# Patient Record
Sex: Female | Born: 1995 | Race: Black or African American | Hispanic: No | Marital: Single | Smoking: Current every day smoker
Health system: Southern US, Community
[De-identification: ages and names within clinical notes are randomized; demographics above are authoritative.]

## PROBLEM LIST (undated history)

## (undated) ENCOUNTER — Inpatient Hospital Stay (HOSPITAL_COMMUNITY): Payer: Self-pay

## (undated) ENCOUNTER — Ambulatory Visit (HOSPITAL_COMMUNITY): Admission: EM | Discharge status: Absent without leave | Payer: Medicaid Other

## (undated) DIAGNOSIS — Z789 Other specified health status: Secondary | ICD-10-CM

## (undated) HISTORY — PX: NO PAST SURGERIES: SHX2092

---

## 2015-10-15 ENCOUNTER — Encounter (HOSPITAL_COMMUNITY): Payer: Self-pay | Admitting: *Deleted

## 2015-10-15 ENCOUNTER — Emergency Department (HOSPITAL_COMMUNITY)
Admission: EM | Admit: 2015-10-15 | Discharge: 2015-10-15 | Disposition: A | Discharge status: Home / Self Care | Payer: Self-pay | Attending: Emergency Medicine | Admitting: Emergency Medicine

## 2015-10-15 DIAGNOSIS — W503XXA Accidental bite by another person, initial encounter: Secondary | ICD-10-CM | POA: Insufficient documentation

## 2015-10-15 DIAGNOSIS — Y998 Other external cause status: Secondary | ICD-10-CM | POA: Insufficient documentation

## 2015-10-15 DIAGNOSIS — Y9289 Other specified places as the place of occurrence of the external cause: Secondary | ICD-10-CM | POA: Insufficient documentation

## 2015-10-15 DIAGNOSIS — S71151A Open bite, right thigh, initial encounter: Secondary | ICD-10-CM | POA: Insufficient documentation

## 2015-10-15 DIAGNOSIS — Z23 Encounter for immunization: Secondary | ICD-10-CM | POA: Insufficient documentation

## 2015-10-15 DIAGNOSIS — S70311A Abrasion, right thigh, initial encounter: Secondary | ICD-10-CM | POA: Insufficient documentation

## 2015-10-15 DIAGNOSIS — F17200 Nicotine dependence, unspecified, uncomplicated: Secondary | ICD-10-CM | POA: Insufficient documentation

## 2015-10-15 DIAGNOSIS — Y9389 Activity, other specified: Secondary | ICD-10-CM | POA: Insufficient documentation

## 2015-10-15 MED ORDER — TETANUS-DIPHTH-ACELL PERTUSSIS 5-2.5-18.5 LF-MCG/0.5 IM SUSP
0.5000 mL | Freq: Once | INTRAMUSCULAR | Status: AC
  Administered 2015-10-15: 0.5 mL via INTRAMUSCULAR
  Filled 2015-10-15: qty 0.5

## 2015-10-15 MED ORDER — AMOXICILLIN-POT CLAVULANATE 875-125 MG PO TABS: 1.0000 | ORAL_TABLET | Freq: Two times a day (BID) | ORAL | Status: DC
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
## 2015-10-15 NOTE — ED Notes (Signed)
Declined W/C at D/C and was escorted to lobby by RN. 

## 2015-10-15 NOTE — Discharge Instructions (Signed)
Please monitor for signs of infection, pain present please begin taking antibiotics., Return to emergency room if any new or worsening signs or symptoms present.

## 2016-05-08 ENCOUNTER — Encounter (HOSPITAL_COMMUNITY): Payer: Self-pay | Admitting: Emergency Medicine

## 2016-05-08 ENCOUNTER — Ambulatory Visit (HOSPITAL_COMMUNITY)
Admission: EM | Admit: 2016-05-08 | Discharge: 2016-05-08 | Disposition: A | Discharge status: Home / Self Care | Payer: Self-pay | Attending: Family Medicine | Admitting: Family Medicine

## 2016-05-08 DIAGNOSIS — N946 Dysmenorrhea, unspecified: Secondary | ICD-10-CM

## 2016-05-08 MED ORDER — IBUPROFEN 800 MG PO TABS: 800.0000 mg | ORAL_TABLET | Freq: Four times a day (QID) | ORAL | 0 refills | Status: DC | PRN

## 2016-05-08 NOTE — ED Provider Notes (Signed)
[facility]  CSN: [id number] Arrival date & time: 05/08/16  1436  History   Chief Complaint Chief Complaint  Patient presents with  . Dysmenorrhea   HPI April Barber is a 20 y.o. female presenting for painful menstrual cramps.   She reports regular monthly periods lasting 5 days since she was 10, initially causing mild cramping pain which has escalated in the past 3 months. She is currently on day 2 of her period and experiencing severe, intermittent crampy lower abdominal pain that is incapacitation to the point that she called out of her 2 jobs today. Excedrin has not helped and in the past tylenol and tramadol have not either. She is sexually active with a single female and uses no hormonal contraception. She is afraid of side effects and stories that she's been told that this has not helped with her friends' with the same problems. No fever, vaginal discharge, dysuria, urinary frequency or urgency. Tolerating po fluids.  HPI  History reviewed. No pertinent past medical history.  There are no active problems to display for this patient.   History reviewed. No pertinent surgical history.  OB History    No data available       Home Medications    Prior to Admission medications   Medication Sig Start Date End Date Taking? Authorizing Provider  ibuprofen (ADVIL,MOTRIN) 800 MG tablet Take 1 tablet (800 mg total) by mouth every 6 (six) hours as needed for cramping. 05/08/16   Tyrone Nineyan B Paulina Muchmore, MD    Family History History reviewed. No pertinent family history.  Social History Social History  Substance Use Topics  . Smoking status: Current Some Day Smoker  . Smokeless tobacco: Never Used  . Alcohol use No     Allergies   Patient has no known allergies.   Review of Systems Review of Systems As above  Physical Exam Triage Vital Signs ED Triage Vitals  Enc Vitals Group     BP 05/08/16 1510 118/65     Pulse Rate 05/08/16 1510 (!) 59     Resp 05/08/16  1510 18     Temp 05/08/16 1510 98.4 F (36.9 C)     Temp Source 05/08/16 1510 Oral     SpO2 05/08/16 1510 100 %     Weight --      Height 05/08/16 1510 5\' 7"  (1.702 m)     Head Circumference --      Peak Flow --      Pain Score 05/08/16 1515 7     Pain Loc --      Pain Edu? --      Excl. in GC? --    No data found.   Updated Vital Signs BP 118/65 (BP Location: Right Arm)   Pulse (!) 59   Temp 98.4 F (36.9 C) (Oral)   Resp 18   Ht 5\' 7"  (1.702 m)   LMP 05/08/2016 (Exact Date)   SpO2 100%   Physical Exam  Constitutional: She is oriented to person, place, and time. She appears well-developed and well-nourished. No distress.  HENT:  moist mucus membranes.   Eyes: EOM are normal. Pupils are equal, round, and reactive to light. No scleral icterus.  Neck: Neck supple. No JVD present.  Cardiovascular: Normal rate, regular rhythm, normal heart sounds and intact distal pulses.   No murmur heard. Pulmonary/Chest: Effort normal and breath sounds normal. No respiratory distress.  Abdominal: Soft. Bowel sounds are normal. She exhibits no distension. There is tenderness (mild  lower abdominal tenderness to palpation without rebound or guarding.).  Musculoskeletal: Normal range of motion. She exhibits no edema or tenderness.  Lymphadenopathy:    She has no cervical adenopathy.  Neurological: She is alert and oriented to person, place, and time. She exhibits normal muscle tone.  Skin: Skin is warm and dry. Capillary refill takes less than 2 seconds.  Vitals reviewed.  UC Treatments / Results  Labs (all labs ordered are listed, but only abnormal results are displayed) Labs Reviewed - No data to display  EKG  EKG Interpretation None       Radiology No results found.  Procedures Procedures (including critical care time)  Medications Ordered in UC Medications - No data to display   Initial Impression / Assessment and Plan / UC Course  I have reviewed the triage vital  signs and the nursing notes.  Pertinent labs & imaging results that were available during my care of the patient were reviewed by me and considered in my medical decision making (see chart for details).  Final Clinical Impressions(s) / UC Diagnoses   Final diagnoses:  Dysmenorrhea   20 y.o. female with longstanding history of dysmenorrhea with acute typical symptoms during menses currently. No peritoneal signs, she is nontoxic. Will Rx ibuprofen 800mg  prn, avoid other NSAIDs while taking this, and follow up with PCP for discussion of birth control options.   New Prescriptions New Prescriptions   IBUPROFEN (ADVIL,MOTRIN) 800 MG TABLET    Take 1 tablet (800 mg total) by mouth every 6 (six) hours as needed for cramping.     Tyrone Nineyan B Jakylan Ron, MD 05/08/16 [phone]

## 2016-05-08 NOTE — ED Triage Notes (Signed)
[facility]. The patient reported that for the last 3 months she has had painful cramping during menstruation and abdominal pain that starts after eating.

## 2016-06-22 ENCOUNTER — Encounter (HOSPITAL_COMMUNITY): Payer: Self-pay

## 2016-06-22 ENCOUNTER — Emergency Department (HOSPITAL_COMMUNITY)
Admission: EM | Admit: 2016-06-22 | Discharge: 2016-06-22 | Disposition: A | Discharge status: Home / Self Care | Payer: Medicaid Other | Attending: Emergency Medicine | Admitting: Emergency Medicine

## 2016-06-22 ENCOUNTER — Emergency Department (HOSPITAL_COMMUNITY): Payer: Medicaid Other

## 2016-06-22 DIAGNOSIS — O23591 Infection of other part of genital tract in pregnancy, first trimester: Secondary | ICD-10-CM | POA: Insufficient documentation

## 2016-06-22 DIAGNOSIS — O0281 Inappropriate change in quantitative human chorionic gonadotropin (hCG) in early pregnancy: Secondary | ICD-10-CM | POA: Insufficient documentation

## 2016-06-22 DIAGNOSIS — O99331 Smoking (tobacco) complicating pregnancy, first trimester: Secondary | ICD-10-CM | POA: Insufficient documentation

## 2016-06-22 DIAGNOSIS — R21 Rash and other nonspecific skin eruption: Secondary | ICD-10-CM | POA: Insufficient documentation

## 2016-06-22 DIAGNOSIS — Z3A01 Less than 8 weeks gestation of pregnancy: Secondary | ICD-10-CM | POA: Diagnosis not present

## 2016-06-22 DIAGNOSIS — N760 Acute vaginitis: Secondary | ICD-10-CM

## 2016-06-22 DIAGNOSIS — O9989 Other specified diseases and conditions complicating pregnancy, childbirth and the puerperium: Secondary | ICD-10-CM | POA: Insufficient documentation

## 2016-06-22 DIAGNOSIS — B9689 Other specified bacterial agents as the cause of diseases classified elsewhere: Secondary | ICD-10-CM | POA: Insufficient documentation

## 2016-06-22 DIAGNOSIS — R1030 Lower abdominal pain, unspecified: Secondary | ICD-10-CM | POA: Diagnosis not present

## 2016-06-22 DIAGNOSIS — O26891 Other specified pregnancy related conditions, first trimester: Secondary | ICD-10-CM | POA: Diagnosis present

## 2016-06-22 DIAGNOSIS — F17290 Nicotine dependence, other tobacco product, uncomplicated: Secondary | ICD-10-CM | POA: Diagnosis not present

## 2016-06-22 DIAGNOSIS — O26899 Other specified pregnancy related conditions, unspecified trimester: Secondary | ICD-10-CM

## 2016-06-22 DIAGNOSIS — R109 Unspecified abdominal pain: Secondary | ICD-10-CM

## 2016-06-22 LAB — COMPREHENSIVE METABOLIC PANEL
ALK PHOS: 54 U/L (ref 38–126)
ALT: 14 U/L (ref 14–54)
ANION GAP: 9 (ref 5–15)
AST: 18 U/L (ref 15–41)
Albumin: 3.8 g/dL (ref 3.5–5.0)
BUN: 7 mg/dL (ref 6–20)
CALCIUM: 8.9 mg/dL (ref 8.9–10.3)
CHLORIDE: 104 mmol/L (ref 101–111)
CO2: 24 mmol/L (ref 22–32)
Creatinine, Ser: 0.84 mg/dL (ref 0.44–1.00)
GFR calc non Af Amer: >60 mL/min (ref >60)
Glucose, Bld: 98 mg/dL (ref 65–99)
POTASSIUM: 3.5 mmol/L (ref 3.5–5.1)
SODIUM: 137 mmol/L (ref 135–145)
Total Bilirubin: 0.7 mg/dL (ref 0.3–1.2)
Total Protein: 6.5 g/dL (ref 6.5–8.1)

## 2016-06-22 LAB — URINALYSIS, ROUTINE W REFLEX MICROSCOPIC
Bilirubin Urine: NEGATIVE
GLUCOSE, UA: NEGATIVE mg/dL
HGB URINE DIPSTICK: NEGATIVE
KETONES UR: NEGATIVE mg/dL
Nitrite: NEGATIVE
PROTEIN: 30 mg/dL — AB
Specific Gravity, Urine: 1.025 (ref 1.005–1.030)
pH: 8 (ref 5.0–8.0)

## 2016-06-22 LAB — WET PREP, GENITAL
SPERM: NONE SEEN
Trich, Wet Prep: NONE SEEN
WBC WET PREP: NONE SEEN
Yeast Wet Prep HPF POC: NONE SEEN

## 2016-06-22 LAB — CBC
HEMATOCRIT: 36.1 % (ref 36.0–46.0)
HEMOGLOBIN: 12.3 g/dL (ref 12.0–15.0)
MCH: 27.6 pg (ref 26.0–34.0)
MCHC: 34.1 g/dL (ref 30.0–36.0)
MCV: 80.9 fL (ref 78.0–100.0)
Platelets: 227 10*3/uL (ref 150–400)
RBC: 4.46 MIL/uL (ref 3.87–5.11)
RDW: 12.6 % (ref 11.5–15.5)
WBC: 5.2 10*3/uL (ref 4.0–10.5)

## 2016-06-22 LAB — I-STAT BETA HCG BLOOD, ED (MC, WL, AP ONLY): I-stat hCG, quantitative: >2000 m[IU]/mL — ABNORMAL HIGH (ref <5)

## 2016-06-22 LAB — LIPASE, BLOOD: LIPASE: 21 U/L (ref 11–51)

## 2016-06-22 LAB — HCG, QUANTITATIVE, PREGNANCY: hCG, Beta Chain, Quant, S: 25051 m[IU]/mL — ABNORMAL HIGH (ref <5)

## 2016-06-22 MED ORDER — PRENATAL COMPLETE 14-0.4 MG PO TABS: 2.0000 | ORAL_TABLET | Freq: Every day | ORAL | 0 refills | Status: DC

## 2016-06-22 MED ORDER — HYDROCORTISONE 2.5 % EX LOTN
TOPICAL_LOTION | Freq: Two times a day (BID) | CUTANEOUS | 0 refills | Status: DC
Start: 2016-06-22 — End: 2016-09-25

## 2016-06-22 MED ORDER — METRONIDAZOLE 500 MG PO TABS: 500.0000 mg | ORAL_TABLET | Freq: Two times a day (BID) | ORAL | 0 refills | Status: DC

## 2016-06-22 NOTE — ED Notes (Signed)
Pt returned from US

## 2016-06-22 NOTE — Discharge Instructions (Addendum)
1. Medications: prenatal vitamins, Flagyl, hydrocortisone lotion, usual home medications 2. Treatment: rest, drink plenty of fluids, eat bland meals, small and often 3. Follow Up: Please followup with OB/GYN in 1-2 weeks for discussion of your diagnoses and further evaluation after today's visit; if you do not have a primary care doctor use the resource guide provided to find one; Please return to the ER for persistent vomiting, vaginal bleeding, syncope, worsening abd pain, high fevers or worsening symptoms

## 2016-06-22 NOTE — ED Triage Notes (Signed)
Onset first week in December intermittant lower abd pain, occurs off and on all day.  Unsure pregnancy status.  Onset 3-4 weeks itchy rash on right forearm.  Has been using  Benadryl cream with some relief in itching.

## 2016-06-22 NOTE — ED Notes (Signed)
Patient transported to US 

## 2016-06-22 NOTE — ED Provider Notes (Signed)
[facility]   CSN: [id number] Arrival date & time: 06/22/16  1815     History   Chief Complaint Chief Complaint  Patient presents with  . Abdominal Pain  . Rash    HPI April Barber is a 20 y.o. female G1P0 with No major medical history presents to the Emergency Department intermittent lower abdominal pain described as cramping onset several weeks ago. Patient reports she is sexually active with 1 female partner. No history of STDs, no vaginal discharge, no vaginal bleeding. Last menstrual cycle was 05/10/2016. Patient has not taken a home pregnancy test. She was not trying to get pregnant. She reports nausea intermittently and several bouts of emesis over the last several weeks. She denies fever, chills, diarrhea, weakness, dizziness, syncope, dysuria, hematuria, vaginal discharge.  Patient is also complaining of 2 months of dry itchy rash to the right forearm. Patient has used topical Benadryl without relief. The rash is no where else else on the patient's body. Fevers or chills, nausea or vomiting. No recent travel.  The history is provided by the patient and medical records. No language interpreter was used.    History reviewed. No pertinent past medical history.  There are no active problems to display for this patient.   History reviewed. No pertinent surgical history.  OB History    No data available       Home Medications    Prior to Admission medications   Medication Sig Start Date End Date Taking? Authorizing Provider  hydrocortisone 2.5 % lotion Apply topically 2 (two) times daily. 06/22/16   Ceniya Fowers, PA-C  ibuprofen (ADVIL,MOTRIN) 800 MG tablet Take 1 tablet (800 mg total) by mouth every 6 (six) hours as needed for cramping. 05/08/16   Tyrone Nineyan B Grunz, MD  metroNIDAZOLE (FLAGYL) 500 MG tablet Take 1 tablet (500 mg total) by mouth 2 (two) times daily. One po bid x 7 days 06/22/16   Dahlia ClientHannah Bleu Minerd, PA-C  Prenatal Vit-Fe  Fumarate-FA (PRENATAL COMPLETE) 14-0.4 MG TABS Take 2 tablets by mouth daily. 06/22/16   Dahlia ClientHannah Azilee Pirro, PA-C    Family History History reviewed. No pertinent family history.  Social History Social History  Substance Use Topics  . Smoking status: Current Every Day Smoker    Types: Cigars  . Smokeless tobacco: Never Used     Comment: 2 cigars per day   . Alcohol use Yes     Comment: occ     Allergies   Patient has no known allergies.   Review of Systems Review of Systems  Gastrointestinal: Positive for abdominal pain, nausea and vomiting.  Skin: Positive for rash.  All other systems reviewed and are negative.    Physical Exam Updated Vital Signs BP 114/62   Pulse 71   Temp 98.7 F (37.1 C) (Oral)   Resp 14   LMP 05/10/2016   SpO2 100%   Physical Exam  Constitutional: She appears well-developed and well-nourished. No distress.  Awake, alert, nontoxic appearance  HENT:  Head: Normocephalic and atraumatic.  Mouth/Throat: Oropharynx is clear and moist. No oropharyngeal exudate.  Eyes: Conjunctivae are normal. No scleral icterus.  Neck: Normal range of motion. Neck supple.  Cardiovascular: Normal rate, regular rhythm, normal heart sounds and intact distal pulses.   No murmur heard. Pulmonary/Chest: Effort normal and breath sounds normal. No respiratory distress. She has no wheezes.  Equal chest expansion  Abdominal: Soft. Bowel sounds are normal. She exhibits no mass. There is no tenderness. There is no rebound and  no guarding. Hernia confirmed negative in the right inguinal area and confirmed negative in the left inguinal area.  Genitourinary: Uterus normal. No labial fusion. There is no rash, tenderness or lesion on the right labia. There is no rash, tenderness or lesion on the left labia. Uterus is not deviated, not enlarged, not fixed and not tender. Cervix exhibits no motion tenderness, no discharge and no friability. Right adnexum displays no mass, no  tenderness and no fullness. Left adnexum displays no mass, no tenderness and no fullness. No erythema, tenderness or bleeding in the vagina. No foreign body in the vagina. No signs of injury around the vagina. Vaginal discharge (copious amounts of thick white discharge ) found.  Musculoskeletal: Normal range of motion. She exhibits no edema.  Lymphadenopathy:       Right: No inguinal adenopathy present.       Left: No inguinal adenopathy present.  Neurological: She is alert.  Speech is clear and goal oriented Moves extremities without ataxia  Skin: Skin is warm and dry. Capillary refill takes 2 to 3 seconds. Rash noted. Rash is macular. She is not diaphoretic. No erythema.  Macular and scaling rash to the right lower forearm no urticaria, bulla, vesicles, ulcers or target lesions.  Psychiatric: She has a normal mood and affect.  Nursing note and vitals reviewed.    ED Treatments / Results  Labs (all labs ordered are listed, but only abnormal results are displayed) Labs Reviewed  WET PREP, GENITAL - Abnormal; Notable for the following:       Result Value   Clue Cells Wet Prep HPF POC PRESENT (*)    All other components within normal limits  URINALYSIS, ROUTINE W REFLEX MICROSCOPIC - Abnormal; Notable for the following:    APPearance HAZY (*)    Protein, ur 30 (*)    Leukocytes, UA SMALL (*)    Bacteria, UA RARE (*)    Squamous Epithelial / LPF 6-30 (*)    All other components within normal limits  HCG, QUANTITATIVE, PREGNANCY - Abnormal; Notable for the following:    hCG, Beta Chain, Quant, S 25,051 (*)    All other components within normal limits  I-STAT BETA HCG BLOOD, ED ([facility], [facility], [facility] ONLY) - Abnormal; Notable for the following:    I-stat hCG, quantitative >2,000.0 (*)    All other components within normal limits  LIPASE, BLOOD  COMPREHENSIVE METABOLIC PANEL  CBC  GC/CHLAMYDIA PROBE AMP ([place]) NOT AT [facility]    Radiology US Ob Comp < 14 Wks  Result Date:  06/22/2016 CLINICAL DATA:  Abdominal pain. EXAM: OBSTETRIC <14 WK [place] AND TRANSVAGINAL OB US TECHNIQUE: Both transabdominal and transvaginal ultrasound examinations were performed for complete evaluation of the gestation as well as the maternal uterus, adnexal regions, and pelvic cul-de-sac. Transvaginal technique was performed to assess early pregnancy. COMPARISON:  None. FINDINGS: Intrauterine gestational sac: Single Yolk sac:  Visible Embryo:  Visible Cardiac Activity: This is a Heart Rate: 108  bpm MSD:   mm    w     d CRL:  2.2  mm   5 w   5 d                  [place] EDC: 02/17/2017 Subchorionic hemorrhage:  None visualized. Maternal uterus/adnexae: Normal ovaries.  Trace free pelvic fluid. IMPRESSION: Single living intrauterine gestation measuring 5 weeks 5 days by crown-rump length. Electronically Signed   By: Ellery Plunk M.D.   On: 06/22/2016 21:51  [place] Ob Transvaginal  Result Date: 06/22/2016 CLINICAL DATA:  Abdominal pain. EXAM: OBSTETRIC <14 WK US AND TRANSVAGINAL OB US TECHNIQUE: Both transabdominal and transvaginal ultrasound examinations were performed for complete evaluation of the gestation as well as the maternal uterus, adnexal regions, and pelvic cul-de-sac. Transvaginal technique was performed to assess early pregnancy. COMPARISON:  None. FINDINGS: Intrauterine gestational sac: Single Yolk sac:  Visible Embryo:  Visible Cardiac Activity: This is a Heart Rate: 108  bpm MSD:   mm    w     d CRL:  2.2  mm   5 w   5 d                  US EDC: 02/17/2017 Subchorionic hemorrhage:  None visualized. Maternal uterus/adnexae: Normal ovaries.  Trace free pelvic fluid. IMPRESSION: Single living intrauterine gestation measuring 5 weeks 5 days by crown-rump length. Electronically Signed   By: Ellery Plunkaniel R Mitchell M.D.   On: 06/22/2016 21:51    Procedures Procedures (including critical care time)  Medications Ordered in ED Medications - No data to display   Initial Impression / Assessment and Plan  / ED Course  I have reviewed the triage vital signs and the nursing notes.  Pertinent labs & imaging results that were available during my care of the patient were reviewed by me and considered in my medical decision making (see chart for details).  Clinical Course as of Jun 22 2301  Sat Jun 22, 2016  2103 Positive pregnancy test I-stat hCG, quantitative: (!) >2,000.0 [HM]  2103 Small with negative nitrite. Contaminated sample with rare bacteria. 6-30 white blood cells. Patient without dysuria, hematuria or other symptoms of UTI. Leukocytes, UA: (!) SMALL [HM]  2300 Clue cells and patient with lower abdominal pain. Will treat with metronidazole. Clue Cells Wet Prep HPF POC: (!) PRESENT [HM]  2300 IUP US OB Transvaginal [HM]    Clinical Course User Index [HM] Dahlia ClientHannah Jeanclaude Wentworth, PA-C    Vision presents with complaints of lower abdominal pain. Positive pregnancy test. Ultrasound with evidence of IUP at approximately 5 weeks. Wet prep shows clue cells and patient has large amount of vaginal discharge with lower abdominal pain. Will treat with metronidazole. No evidence of UTI.  Discussed normal progression of pregnancy with patient and reasons to be concerned including worsening pain and vaginal bleeding. Patient referred to OB/GYN.  Patient with nonspecific rash to the right arm present for one month. Does not appear to be allergic in nature. No ulcers, bulla, target lesions. Will give hydrocortisone. Patient is to follow with dermatology.    Final Clinical Impressions(s) / ED Diagnoses   Final diagnoses:  Abdominal pain affecting pregnancy  Lower abdominal pain  Less than [redacted] weeks gestation of pregnancy  Rash  BV (bacterial vaginosis)    New Prescriptions New Prescriptions   HYDROCORTISONE 2.5 % LOTION    Apply topically 2 (two) times daily.   METRONIDAZOLE (FLAGYL) 500 MG TABLET    Take 1 tablet (500 mg total) by mouth 2 (two) times daily. One po bid x 7 days   PRENATAL VIT-FE  FUMARATE-FA (PRENATAL COMPLETE) 14-0.4 MG TABS    Take 2 tablets by mouth daily.     Dahlia ClientHannah Jahlen Bollman, PA-C 06/22/16 [id number]    Pricilla LovelessScott Goldston, MD 06/24/16 [phone]

## 2016-06-25 LAB — GC/CHLAMYDIA PROBE AMP (~~LOC~~) NOT AT ARMC
Chlamydia: POSITIVE — AB
NEISSERIA GONORRHEA: NEGATIVE

## 2016-07-01 NOTE — L&D Delivery Note (Signed)
Delivery Note At 11:23 AM a viable female was delivered via  (Presentation vertex: ; LOA ).  APGAR:7 ,9 ; weight  .   Placenta status: spont, shultz.  Cord: 3vc with the following complications:none .  Cord pH: n/a  Anesthesia: none  Episiotomy:  none Lacerations:  none Suture Repair: n/a Est. Blood Loss 75(mL):    Mom to postpartum.  Baby to Couplet care / Skin to Skin.  April Barber 01/19/2017, 11:33 AM

## 2016-07-02 DIAGNOSIS — Z3A22 22 weeks gestation of pregnancy: Secondary | ICD-10-CM | POA: Insufficient documentation

## 2016-07-02 DIAGNOSIS — O99332 Smoking (tobacco) complicating pregnancy, second trimester: Secondary | ICD-10-CM | POA: Insufficient documentation

## 2016-07-02 DIAGNOSIS — F17290 Nicotine dependence, other tobacco product, uncomplicated: Secondary | ICD-10-CM | POA: Insufficient documentation

## 2016-07-02 DIAGNOSIS — O0281 Inappropriate change in quantitative human chorionic gonadotropin (hCG) in early pregnancy: Secondary | ICD-10-CM | POA: Insufficient documentation

## 2016-07-02 DIAGNOSIS — O219 Vomiting of pregnancy, unspecified: Secondary | ICD-10-CM | POA: Diagnosis present

## 2016-07-03 ENCOUNTER — Emergency Department (HOSPITAL_COMMUNITY)
Admission: EM | Admit: 2016-07-03 | Discharge: 2016-07-03 | Disposition: A | Discharge status: Home / Self Care | Payer: Medicaid Other | Attending: Emergency Medicine | Admitting: Emergency Medicine

## 2016-07-03 ENCOUNTER — Encounter (HOSPITAL_COMMUNITY): Payer: Self-pay | Admitting: Emergency Medicine

## 2016-07-03 DIAGNOSIS — O219 Vomiting of pregnancy, unspecified: Secondary | ICD-10-CM

## 2016-07-03 LAB — CBC WITH DIFFERENTIAL/PLATELET
Basophils Absolute: 0.1 10*3/uL (ref 0.0–0.1)
Basophils Relative: 1 %
EOS ABS: 0.1 10*3/uL (ref 0.0–0.7)
Eosinophils Relative: 1 %
HEMATOCRIT: 38.9 % (ref 36.0–46.0)
HEMOGLOBIN: 13.3 g/dL (ref 12.0–15.0)
LYMPHS ABS: 2.2 10*3/uL (ref 0.7–4.0)
LYMPHS PCT: 30 %
MCH: 27.1 pg (ref 26.0–34.0)
MCHC: 34.2 g/dL (ref 30.0–36.0)
MCV: 79.2 fL (ref 78.0–100.0)
MONOS PCT: 7 %
Monocytes Absolute: 0.5 10*3/uL (ref 0.1–1.0)
NEUTROS PCT: 61 %
Neutro Abs: 4.4 10*3/uL (ref 1.7–7.7)
Platelets: 241 10*3/uL (ref 150–400)
RBC: 4.91 MIL/uL (ref 3.87–5.11)
RDW: 12.4 % (ref 11.5–15.5)
WBC: 7.2 10*3/uL (ref 4.0–10.5)

## 2016-07-03 LAB — URINALYSIS, ROUTINE W REFLEX MICROSCOPIC
BILIRUBIN URINE: NEGATIVE
Bacteria, UA: NONE SEEN
Glucose, UA: NEGATIVE mg/dL
HGB URINE DIPSTICK: NEGATIVE
Ketones, ur: 80 mg/dL — AB
Nitrite: NEGATIVE
PH: 6 (ref 5.0–8.0)
Protein, ur: 100 mg/dL — AB
SPECIFIC GRAVITY, URINE: 1.033 — AB (ref 1.005–1.030)

## 2016-07-03 LAB — COMPREHENSIVE METABOLIC PANEL
ALK PHOS: 49 U/L (ref 38–126)
ALT: 19 U/L (ref 14–54)
ANION GAP: 15 (ref 5–15)
AST: 21 U/L (ref 15–41)
Albumin: 4.2 g/dL (ref 3.5–5.0)
BILIRUBIN TOTAL: 0.9 mg/dL (ref 0.3–1.2)
BUN: 7 mg/dL (ref 6–20)
CALCIUM: 9.5 mg/dL (ref 8.9–10.3)
CO2: 22 mmol/L (ref 22–32)
CREATININE: 0.86 mg/dL (ref 0.44–1.00)
Chloride: 101 mmol/L (ref 101–111)
Glucose, Bld: 82 mg/dL (ref 65–99)
Potassium: 3.5 mmol/L (ref 3.5–5.1)
SODIUM: 138 mmol/L (ref 135–145)
TOTAL PROTEIN: 7.5 g/dL (ref 6.5–8.1)

## 2016-07-03 LAB — HCG, QUANTITATIVE, PREGNANCY: hCG, Beta Chain, Quant, S: 62949 m[IU]/mL — ABNORMAL HIGH (ref <5)

## 2016-07-03 MED ORDER — ONDANSETRON HCL 4 MG/2ML IJ SOLN
4.0000 mg | Freq: Once | INTRAMUSCULAR | Status: AC
  Administered 2016-07-03: 4 mg via INTRAVENOUS
  Filled 2016-07-03: qty 2

## 2016-07-03 MED ORDER — DEXTROSE IN LACTATED RINGERS 5 % IV SOLN
Freq: Once | INTRAVENOUS | Status: AC
  Administered 2016-07-03: 02:00:00 via INTRAVENOUS

## 2016-07-03 MED ORDER — ONDANSETRON 4 MG PO TBDP: 4.0000 mg | ORAL_TABLET | Freq: Three times a day (TID) | ORAL | 0 refills | Status: DC | PRN

## 2016-07-03 NOTE — ED Notes (Signed)
Pt attempting water for PO challenge per NP order at this time

## 2016-07-03 NOTE — ED Triage Notes (Signed)
Patient reports persistent vomittng with headache and mild abdominal pain , denies diarrhea or fever . She is approx. [redacted] weeks pregnant with no prenatal visit .

## 2016-07-03 NOTE — Discharge Instructions (Signed)
Tonight you treated for nausea and vomiting.  Your urine was tested again showing no sign of infection.  U been given a prescription for Zofran and again used to help control your nausea.  Make sure to keep your appointment as scheduled with your OB.  Return if you are unable to keep down any fluids.  He had no urinary output develop a fever.

## 2016-07-03 NOTE — ED Provider Notes (Signed)
[facility]EMERGENCY DEPT Provider Note   CSN: [id number] Arrival date & time: 07/02/16  2350     History   Chief Complaint Chief Complaint  Patient presents with  . Emesis During Pregnancy  . Headache    HPI April Barber is a 21 y.o. female.  This is a 21 year old female who presents with continued nausea and vomiting.  She found out she was pregnant.  This emergency department on December 23.  She is developed nausea approximately 5 days later she was also diagnosed at that time with Chlamydia and BV.  She has completed the antibiotics for Chlamydia, but not for BV.  She has not purchased these as of yet.  She has an appointment January 22 for her first OB/GYN appointment. She states that everything she eats comes back.  She's been tolerating small amounts of water.  She reports no change in urination. She reports abdominal pain in the upper abdomen area, more like "hunger pains" Denies any vaginal discharge, vaginal bleeding      History reviewed. No pertinent past medical history.  There are no active problems to display for this patient.   History reviewed. No pertinent surgical history.  OB History    No data available       Home Medications    Prior to Admission medications   Medication Sig Start Date End Date Taking? Authorizing Provider  hydrocortisone 2.5 % lotion Apply topically 2 (two) times daily. 06/22/16   Hannah Muthersbaugh, PA-C  ibuprofen (ADVIL,MOTRIN) 800 MG tablet Take 1 tablet (800 mg total) by mouth every 6 (six) hours as needed for cramping. 05/08/16   Tyrone Nineyan B Grunz, MD  metroNIDAZOLE (FLAGYL) 500 MG tablet Take 1 tablet (500 mg total) by mouth 2 (two) times daily. One po bid x 7 days 06/22/16   Dahlia ClientHannah Muthersbaugh, PA-C  ondansetron (ZOFRAN ODT) 4 MG disintegrating tablet Take 1 tablet (4 mg total) by mouth every 8 (eight) hours as needed for nausea or vomiting. 07/03/16   Earley FavorGail Montario Zilka, NP  Prenatal Vit-Fe Fumarate-FA (PRENATAL COMPLETE) 14-0.4 MG  TABS Take 2 tablets by mouth daily. 06/22/16   Dahlia ClientHannah Muthersbaugh, PA-C    Family History No family history on file.  Social History Social History  Substance Use Topics  . Smoking status: Current Every Day Smoker    Types: Cigars  . Smokeless tobacco: Never Used     Comment: 2 cigars per day   . Alcohol use Yes     Comment: occ     Allergies   Patient has no known allergies.   Review of Systems Review of Systems  Constitutional: Negative for chills and fever.  Genitourinary: Negative for decreased urine volume, dysuria, vaginal bleeding and vaginal discharge.  All other systems reviewed and are negative.    Physical Exam Updated Vital Signs BP 123/70 (BP Location: Left Arm)   Pulse 75   Temp 98.2 F (36.8 C) (Oral)   Resp 18   Ht 5\' 7"  (1.702 m)   Wt 68 kg   LMP 05/10/2016   SpO2 100%   BMI 23.49 kg/m   Physical Exam  Constitutional: She appears well-developed and well-nourished. No distress.  Eyes: Pupils are equal, round, and reactive to light.  Cardiovascular: Normal rate.   Pulmonary/Chest: Effort normal.  Abdominal: Soft. Bowel sounds are normal. She exhibits no distension. There is no tenderness.  Musculoskeletal: Normal range of motion.  Neurological: She is alert.  Skin: Skin is warm.  Psychiatric: She has a normal mood and  affect.  Nursing note and vitals reviewed.    ED Treatments / Results  Labs (all labs ordered are listed, but only abnormal results are displayed) Labs Reviewed  URINALYSIS, ROUTINE W REFLEX MICROSCOPIC - Abnormal; Notable for the following:       Result Value   Color, Urine AMBER (*)    APPearance CLOUDY (*)    Specific Gravity, Urine 1.033 (*)    Ketones, ur 80 (*)    Protein, ur 100 (*)    Leukocytes, UA SMALL (*)    Squamous Epithelial / LPF 6-30 (*)    All other components within normal limits  HCG, QUANTITATIVE, PREGNANCY - Abnormal; Notable for the following:    hCG, Beta Chain, Quant, S 62,949 (*)    All  other components within normal limits  CBC WITH DIFFERENTIAL/PLATELET  COMPREHENSIVE METABOLIC PANEL    EKG  EKG Interpretation None       Radiology No results found.  Procedures Procedures (including critical care time)  Medications Ordered in ED Medications  dextrose 5 % in lactated ringers infusion ( Intravenous Stopped 07/03/16 0300)  ondansetron (ZOFRAN) injection 4 mg (4 mg Intravenous Given 07/03/16 0158)     Initial Impression / Assessment and Plan / ED Course  I have reviewed the triage vital signs and the nursing notes.  Pertinent labs & imaging results that were available during my care of the patient were reviewed by me and considered in my medical decision making (see chart for details).  Clinical Course    Patient does not appear to be in distress.  She was given a liter of D5 LR Zofran and reassessed Patient is now tolerating liquids.  No longer having any abdominal discomfort   Final Clinical Impressions(s) / ED Diagnoses   Final diagnoses:  Nausea and vomiting during pregnancy prior to [redacted] weeks gestation    New Prescriptions New Prescriptions   ONDANSETRON (ZOFRAN ODT) 4 MG DISINTEGRATING TABLET    Take 1 tablet (4 mg total) by mouth every 8 (eight) hours as needed for nausea or vomiting.     Earley Favor, NP 07/03/16 1610    Shon Baton, MD 07/03/16 [phone]

## 2016-07-23 ENCOUNTER — Encounter: Payer: Self-pay | Admitting: Obstetrics & Gynecology

## 2016-07-23 ENCOUNTER — Ambulatory Visit (INDEPENDENT_AMBULATORY_CARE_PROVIDER_SITE_OTHER): Payer: Self-pay | Admitting: Obstetrics & Gynecology

## 2016-07-23 ENCOUNTER — Other Ambulatory Visit (HOSPITAL_COMMUNITY)
Admission: RE | Admit: 2016-07-23 | Discharge: 2016-07-23 | Disposition: A | Discharge status: Home / Self Care | Payer: Medicaid Other | Source: Ambulatory Visit | Attending: Obstetrics & Gynecology | Admitting: Obstetrics & Gynecology

## 2016-07-23 VITALS — BP 118/68 | HR 80 | Wt 154.0 lb

## 2016-07-23 DIAGNOSIS — Z01419 Encounter for gynecological examination (general) (routine) without abnormal findings: Secondary | ICD-10-CM | POA: Insufficient documentation

## 2016-07-23 DIAGNOSIS — Z23 Encounter for immunization: Secondary | ICD-10-CM

## 2016-07-23 DIAGNOSIS — Z113 Encounter for screening for infections with a predominantly sexual mode of transmission: Secondary | ICD-10-CM | POA: Insufficient documentation

## 2016-07-23 DIAGNOSIS — Z3490 Encounter for supervision of normal pregnancy, unspecified, unspecified trimester: Secondary | ICD-10-CM | POA: Insufficient documentation

## 2016-07-23 DIAGNOSIS — Z3491 Encounter for supervision of normal pregnancy, unspecified, first trimester: Secondary | ICD-10-CM

## 2016-07-23 NOTE — Addendum Note (Signed)
Addended by: Maretta BeesMCGLASHAN, Hasana Alcorta J on: 07/23/2016 03:01 PM   Modules accepted: Orders

## 2016-07-23 NOTE — Progress Notes (Signed)
  Subjective:    April Barber is a S AA G1P0 4029w4d being seen today for her first obstetrical visit.  Her obstetrical history is significant for none. Patient does intend to breast feed. Pregnancy history fully reviewed.  Patient reports no complaints.  Vitals:   07/23/16 1344  BP: 118/68  Pulse: 80  Weight: 154 lb (69.9 kg)    HISTORY: OB History  Gravida Para Term Preterm AB Living  1            SAB TAB Ectopic Multiple Live Births               # Outcome Date GA Lbr Len/2nd Weight Sex Delivery Anes PTL Lv  1 Current              No past medical history on file. No past surgical history on file. No family history on file.   Exam    Uterus:     Pelvic Exam:    Perineum: No Hemorrhoids   Vulva: normal   Vagina:  normal mucosa   pH:    Cervix: anteverted   Adnexa: normal adnexa   Bony Pelvis: android  System: Breast:  normal appearance, no masses or tenderness   Skin: normal coloration and turgor, no rashes    Neurologic: oriented   Extremities: normal strength, tone, and muscle mass   HEENT PERRLA   Mouth/Teeth mucous membranes moist, pharynx normal without lesions   Neck supple   Cardiovascular: regular rate and rhythm   Respiratory:  appears well, vitals normal, no respiratory distress, acyanotic, normal RR, ear and throat exam is normal, neck free of mass or lymphadenopathy, chest clear, no wheezing, crepitations, rhonchi, normal symmetric air entry   Abdomen: soft, non-tender; bowel sounds normal; no masses,  no organomegaly   Urinary: urethral meatus normal      Assessment:    Pregnancy: G1P0 Patient Active Problem List   Diagnosis Date Noted  . Supervision of normal pregnancy, antepartum 07/23/2016        Plan:     Initial labs drawn. Prenatal vitamins. Problem list reviewed and updated. Genetic Screening discussed First Screen: ordered.  Ultrasound discussed; fetal survey: requested.  Follow up in 2 weeks. Flu vaccine today Start  [employer] [employer] [facility] We had a long discussion about her + CT. She plans to tell FOB April Barber today and he will get treatment at the health dept.  Allie BossierMyra C Yon Schiffman 07/23/2016

## 2016-07-23 NOTE — Progress Notes (Signed)
+  chlamydia 06/22/16. Pt has not picked up medication yet. Pt wants 1st tri screen.

## 2016-07-24 LAB — GC/CHLAMYDIA PROBE AMP (~~LOC~~) NOT AT ARMC
CHLAMYDIA, DNA PROBE: NEGATIVE
Neisseria Gonorrhea: NEGATIVE

## 2016-07-25 LAB — URINE CULTURE, OB REFLEX: ORGANISM ID, BACTERIA: NO GROWTH

## 2016-07-25 LAB — CULTURE, OB URINE

## 2016-07-26 LAB — OBSTETRIC PANEL, INCLUDING HIV
Antibody Screen: NEGATIVE
BASOS ABS: 0 10*3/uL (ref 0.0–0.2)
Basos: 0 %
EOS (ABSOLUTE): 0.2 10*3/uL (ref 0.0–0.4)
Eos: 3 %
HEP B S AG: NEGATIVE
HIV Screen 4th Generation wRfx: NONREACTIVE
Hematocrit: 35.7 % (ref 34.0–46.6)
Hemoglobin: 11.4 g/dL (ref 11.1–15.9)
IMMATURE GRANULOCYTES: 0 %
Immature Grans (Abs): 0 10*3/uL (ref 0.0–0.1)
LYMPHS ABS: 2.1 10*3/uL (ref 0.7–3.1)
Lymphs: 28 %
MCH: 26.2 pg — AB (ref 26.6–33.0)
MCHC: 31.9 g/dL (ref 31.5–35.7)
MCV: 82 fL (ref 79–97)
Monocytes Absolute: 0.5 10*3/uL (ref 0.1–0.9)
Monocytes: 6 %
NEUTROS PCT: 63 %
Neutrophils Absolute: 4.6 10*3/uL (ref 1.4–7.0)
PLATELETS: 250 10*3/uL (ref 150–379)
RBC: 4.35 x10E6/uL (ref 3.77–5.28)
RDW: 13.7 % (ref 12.3–15.4)
RPR: NONREACTIVE
Rh Factor: POSITIVE
Rubella Antibodies, IGG: 10 index (ref >0.99)
WBC: 7.5 10*3/uL (ref 3.4–10.8)

## 2016-07-26 LAB — HEMOGLOBINOPATHY EVALUATION
HEMOGLOBIN A2 QUANTITATION: 2.4 % (ref 1.8–3.2)
HGB A: 97.6 % (ref 96.4–98.8)
HGB C: 0 %
HGB S: 0 %
HGB VARIANT: 0 %
Hemoglobin F Quantitation: 0 % (ref 0.0–2.0)

## 2016-07-26 LAB — VARICELLA ZOSTER ANTIBODY, IGG: VARICELLA: 3575 {index} (ref >165)

## 2016-07-26 LAB — CYTOLOGY - PAP: DIAGNOSIS: NEGATIVE

## 2016-07-28 LAB — TOXASSURE SELECT 13 (MW), URINE

## 2016-08-06 ENCOUNTER — Encounter: Payer: Self-pay | Admitting: Obstetrics & Gynecology

## 2016-08-06 DIAGNOSIS — F1290 Cannabis use, unspecified, uncomplicated: Secondary | ICD-10-CM | POA: Insufficient documentation

## 2016-08-07 ENCOUNTER — Ambulatory Visit (INDEPENDENT_AMBULATORY_CARE_PROVIDER_SITE_OTHER): Payer: Medicaid Other | Admitting: Obstetrics & Gynecology

## 2016-08-07 DIAGNOSIS — Z3401 Encounter for supervision of normal first pregnancy, first trimester: Secondary | ICD-10-CM

## 2016-08-07 DIAGNOSIS — Z3400 Encounter for supervision of normal first pregnancy, unspecified trimester: Secondary | ICD-10-CM

## 2016-08-07 NOTE — Patient Instructions (Signed)
Second Trimester of Pregnancy The second trimester is from week 13 through week 28 (months 4 through 6). The second trimester is often a time when you feel your best. Your body has also adjusted to being pregnant, and you begin to feel better physically. Usually, morning sickness has lessened or quit completely, you may have more energy, and you may have an increase in appetite. The second trimester is also a time when the fetus is growing rapidly. At the end of the sixth month, the fetus is about 9 inches long and weighs about 1 pounds. You will likely begin to feel the baby move (quickening) between 18 and 20 weeks of the pregnancy. Body changes during your second trimester Your body continues to go through many changes during your second trimester. The changes vary from woman to woman.  Your weight will continue to increase. You will notice your lower abdomen bulging out.  You may begin to get stretch marks on your hips, abdomen, and breasts.  You may develop headaches that can be relieved by medicines. The medicines should be approved by your health care provider.  You may urinate more often because the fetus is pressing on your bladder.  You may develop or continue to have heartburn as a result of your pregnancy.  You may develop constipation because certain hormones are causing the muscles that push waste through your intestines to slow down.  You may develop hemorrhoids or swollen, bulging veins (varicose veins).  You may have back pain. This is caused by:  Weight gain.  Pregnancy hormones that are relaxing the joints in your pelvis.  A shift in weight and the muscles that support your balance.  Your breasts will continue to grow and they will continue to become tender.  Your gums may bleed and may be sensitive to brushing and flossing.  Dark spots or blotches (chloasma, mask of pregnancy) may develop on your face. This will likely fade after the baby is born.  A dark line  from your belly button to the pubic area (linea nigra) may appear. This will likely fade after the baby is born.  You may have changes in your hair. These can include thickening of your hair, rapid growth, and changes in texture. Some women also have hair loss during or after pregnancy, or hair that feels dry or thin. Your hair will most likely return to normal after your baby is born. What to expect at prenatal visits During a routine prenatal visit:  You will be weighed to make sure you and the fetus are growing normally.  Your blood pressure will be taken.  Your abdomen will be measured to track your baby's growth.  The fetal heartbeat will be listened to.  Any test results from the previous visit will be discussed. Your health care provider may ask you:  How you are feeling.  If you are feeling the baby move.  If you have had any abnormal symptoms, such as leaking fluid, bleeding, severe headaches, or abdominal cramping.  If you are using any tobacco products, including cigarettes, chewing tobacco, and electronic cigarettes.  If you have any questions. Other tests that may be performed during your second trimester include:  Blood tests that check for:  Low iron levels (anemia).  Gestational diabetes (between 24 and 28 weeks).  Rh antibodies. This is to check for a protein on red blood cells (Rh factor).  Urine tests to check for infections, diabetes, or protein in the urine.  An ultrasound to   confirm the proper growth and development of the baby.  An amniocentesis to check for possible genetic problems.  Fetal screens for spina bifida and Down syndrome.  HIV (human immunodeficiency virus) testing. Routine prenatal testing includes screening for HIV, unless you choose not to have this test. Follow these instructions at home: Eating and drinking  Continue to eat regular, healthy meals.  Avoid raw meat, uncooked cheese, cat litter boxes, and soil used by cats. These  carry germs that can cause birth defects in the baby.  Take your prenatal vitamins.  Take 1500-2000 mg of calcium daily starting at the 20th week of pregnancy until you deliver your baby.  If you develop constipation:  Take over-the-counter or prescription medicines.  Drink enough fluid to keep your urine clear or pale yellow.  Eat foods that are high in fiber, such as fresh fruits and vegetables, whole grains, and beans.  Limit foods that are high in fat and processed sugars, such as fried and sweet foods. Activity  Exercise only as directed by your health care provider. Experiencing uterine cramps is a good sign to stop exercising.  Avoid heavy lifting, wear low heel shoes, and practice good posture.  Wear your seat belt at all times when driving.  Rest with your legs elevated if you have leg cramps or low back pain.  Wear a good support bra for breast tenderness.  Do not use hot tubs, steam rooms, or saunas. Lifestyle  Avoid all smoking, herbs, alcohol, and unprescribed drugs. These chemicals affect the formation and growth of the baby.  Do not use any products that contain nicotine or tobacco, such as cigarettes and e-cigarettes. If you need help quitting, ask your health care provider.  A sexual relationship may be continued unless your health care provider directs you otherwise. General instructions  Follow your health care provider's instructions regarding medicine use. There are medicines that are either safe or unsafe to take during pregnancy.  Take warm sitz baths to soothe any pain or discomfort caused by hemorrhoids. Use hemorrhoid cream if your health care provider approves.  If you develop varicose veins, wear support hose. Elevate your feet for 15 minutes, 3-4 times a day. Limit salt in your diet.  Visit your dentist if you have not gone yet during your pregnancy. Use a soft toothbrush to brush your teeth and be gentle when you floss.  Keep all follow-up  prenatal visits as told by your health care provider. This is important. Contact a health care provider if:  You have dizziness.  You have mild pelvic cramps, pelvic pressure, or nagging pain in the abdominal area.  You have persistent nausea, vomiting, or diarrhea.  You have a bad smelling vaginal discharge.  You have pain with urination. Get help right away if:  You have a fever.  You are leaking fluid from your vagina.  You have spotting or bleeding from your vagina.  You have severe abdominal cramping or pain.  You have rapid weight gain or weight loss.  You have shortness of breath with chest pain.  You notice sudden or extreme swelling of your face, hands, ankles, feet, or legs.  You have not felt your baby move in over an hour.  You have severe headaches that do not go away with medicine.  You have vision changes. Summary  The second trimester is from week 13 through week 28 (months 4 through 6). It is also a time when the fetus is growing rapidly.  Your body goes   through many changes during pregnancy. The changes vary from woman to woman.  Avoid all smoking, herbs, alcohol, and unprescribed drugs. These chemicals affect the formation and growth your baby.  Do not use any tobacco products, such as cigarettes, chewing tobacco, and e-cigarettes. If you need help quitting, ask your health care provider.  Contact your health care provider if you have any questions. Keep all prenatal visits as told by your health care provider. This is important. This information is not intended to replace advice given to you by your health care provider. Make sure you discuss any questions you have with your health care provider. Document Released: 06/11/2001 Document Revised: 11/23/2015 Document Reviewed: 08/18/2012 Elsevier Interactive Patient Education  2017 [employer]  

## 2016-08-07 NOTE — Progress Notes (Signed)
   PRENATAL VISIT NOTE  Subjective:  April Barber is a 21 y.o. G1P0 at 1731w5d being seen today for ongoing prenatal care.  She is currently monitored for the following issues for this low-risk pregnancy and has Supervision of normal pregnancy, antepartum and Marijuana use on her problem list.  Patient reports vomiting.  Contractions: Not present. Vag. Bleeding: None.   . Denies leaking of fluid.   The following portions of the patient's history were reviewed and updated as appropriate: allergies, current medications, past family history, past medical history, past social history, past surgical history and problem list. Problem list updated.  Objective:   Vitals:   08/07/16 0908  BP: 113/72  Pulse: 74  Temp: 98.4 F (36.9 C)  Weight: 152 lb 6.4 oz (69.1 kg)    Fetal Status: Fetal Heart Rate (bpm): 165         General:  Alert, oriented and cooperative. Patient is in no acute distress.  Skin: Skin is warm and dry. No rash noted.   Cardiovascular: Normal heart rate noted  Respiratory: Normal respiratory effort, no problems with respiration noted  Abdomen: Soft, gravid, appropriate for gestational age. Pain/Pressure: Present     Pelvic:  Cervical exam deferred        Extremities: Normal range of motion.  Edema: None  Mental Status: Normal mood and affect. Normal behavior. Normal judgment and thought content.   Assessment and Plan:  Pregnancy: G1P0 at 731w5d  1. Supervision of normal first pregnancy, antepartum First screen is scheduled - US MFM OB COMP + 14 WK; Future  General obstetric precautions including but not limited to vaginal bleeding, contractions, leaking of fluid and fetal movement were reviewed in detail with the patient. Please refer to After Visit Summary for other counseling recommendations.  Return in about 8 weeks (around 10/02/2016).   Adam PhenixJames G Zygmunt Mcglinn, MD

## 2016-08-07 NOTE — Progress Notes (Signed)
Patient states that she feels good today. 

## 2016-08-09 ENCOUNTER — Ambulatory Visit (HOSPITAL_COMMUNITY)
Admission: RE | Admit: 2016-08-09 | Discharge: 2016-08-09 | Disposition: A | Discharge status: Home / Self Care | Payer: Medicaid Other | Source: Ambulatory Visit | Attending: Obstetrics & Gynecology | Admitting: Obstetrics & Gynecology

## 2016-08-09 ENCOUNTER — Encounter (HOSPITAL_COMMUNITY): Payer: Self-pay

## 2016-08-09 DIAGNOSIS — Z3491 Encounter for supervision of normal pregnancy, unspecified, first trimester: Secondary | ICD-10-CM | POA: Diagnosis not present

## 2016-08-09 DIAGNOSIS — Z3A13 13 weeks gestation of pregnancy: Secondary | ICD-10-CM | POA: Diagnosis not present

## 2016-08-09 DIAGNOSIS — Z3490 Encounter for supervision of normal pregnancy, unspecified, unspecified trimester: Secondary | ICD-10-CM

## 2016-08-09 HISTORY — DX: Other specified health status: Z78.9

## 2016-08-14 ENCOUNTER — Other Ambulatory Visit: Payer: Self-pay

## 2016-09-20 ENCOUNTER — Ambulatory Visit (HOSPITAL_COMMUNITY)
Admission: RE | Admit: 2016-09-20 | Discharge: 2016-09-20 | Disposition: A | Discharge status: Home / Self Care | Payer: Medicaid Other | Source: Ambulatory Visit | Attending: Obstetrics & Gynecology | Admitting: Obstetrics & Gynecology

## 2016-09-20 DIAGNOSIS — Z3A19 19 weeks gestation of pregnancy: Secondary | ICD-10-CM | POA: Insufficient documentation

## 2016-09-20 DIAGNOSIS — Z3402 Encounter for supervision of normal first pregnancy, second trimester: Secondary | ICD-10-CM | POA: Insufficient documentation

## 2016-09-20 DIAGNOSIS — Z3400 Encounter for supervision of normal first pregnancy, unspecified trimester: Secondary | ICD-10-CM

## 2016-09-25 ENCOUNTER — Encounter (HOSPITAL_COMMUNITY): Payer: Self-pay

## 2016-09-25 ENCOUNTER — Inpatient Hospital Stay (HOSPITAL_COMMUNITY)
Admission: AD | Admit: 2016-09-25 | Discharge: 2016-09-25 | Disposition: A | Discharge status: Home / Self Care | Payer: Medicaid Other | Source: Ambulatory Visit | Attending: Obstetrics & Gynecology | Admitting: Obstetrics & Gynecology

## 2016-09-25 DIAGNOSIS — Z3A19 19 weeks gestation of pregnancy: Secondary | ICD-10-CM | POA: Diagnosis not present

## 2016-09-25 DIAGNOSIS — O26892 Other specified pregnancy related conditions, second trimester: Secondary | ICD-10-CM | POA: Insufficient documentation

## 2016-09-25 DIAGNOSIS — N949 Unspecified condition associated with female genital organs and menstrual cycle: Secondary | ICD-10-CM

## 2016-09-25 DIAGNOSIS — Z87891 Personal history of nicotine dependence: Secondary | ICD-10-CM | POA: Insufficient documentation

## 2016-09-25 DIAGNOSIS — R109 Unspecified abdominal pain: Secondary | ICD-10-CM

## 2016-09-25 LAB — CBC
HEMATOCRIT: 33.1 % — AB (ref 36.0–46.0)
HEMOGLOBIN: 11.1 g/dL — AB (ref 12.0–15.0)
MCH: 27.6 pg (ref 26.0–34.0)
MCHC: 33.5 g/dL (ref 30.0–36.0)
MCV: 82.3 fL (ref 78.0–100.0)
Platelets: 209 10*3/uL (ref 150–400)
RBC: 4.02 MIL/uL (ref 3.87–5.11)
RDW: 13.7 % (ref 11.5–15.5)
WBC: 8.1 10*3/uL (ref 4.0–10.5)

## 2016-09-25 LAB — URINALYSIS, ROUTINE W REFLEX MICROSCOPIC
Bilirubin Urine: NEGATIVE
GLUCOSE, UA: NEGATIVE mg/dL
HGB URINE DIPSTICK: NEGATIVE
KETONES UR: NEGATIVE mg/dL
NITRITE: NEGATIVE
PH: 6 (ref 5.0–8.0)
Protein, ur: NEGATIVE mg/dL
Specific Gravity, Urine: 1.024 (ref 1.005–1.030)

## 2016-09-25 MED ORDER — IBUPROFEN 600 MG PO TABS
600.0000 mg | ORAL_TABLET | Freq: Once | ORAL | Status: AC
  Administered 2016-09-25: 600 mg via ORAL
  Filled 2016-09-25: qty 1

## 2016-09-25 NOTE — MAU Provider Note (Signed)
History     CSN: [id number]  Arrival date and time: 09/25/16 [id number]   First Provider Initiated Contact with Patient 09/25/16 1027      Chief Complaint  Patient presents with  . Abdominal Pain  . Back Pain   HPI   April Barber is a 21 y.o. female G1P0 @ 8033w5d here with abdominal pain that started 3 days ago. The pain is located on both sides of her lower stomach, worse on the left side. The pain comes and goes, she has not taken anything for the pain today. She has tried tylenol without relief.   The pain is better if she is sitting still in the bed, the pain worsens when she lays flat or changes position too much.   She denies vaginal bleeding. Patient is accompanied by her mother who states that the patient is just uncomfortable being pregnant.   OB History    Gravida Para Term Preterm AB Living   1             SAB TAB Ectopic Multiple Live Births                  Past Medical History:  Diagnosis Date  . Medical history non-contributory     Past Surgical History:  Procedure Laterality Date  . NO PAST SURGERIES      Family History  Problem Relation Age of Onset  . Hypertension Mother   . Diabetes Maternal Uncle     Social History  Substance Use Topics  . Smoking status: Former Smoker    Types: Cigars  . Smokeless tobacco: Never Used     Comment: 2 cigars per day   . Alcohol use Yes     Comment: occ    Allergies: No Known Allergies  Prescriptions Prior to Admission  Medication Sig Dispense Refill Last Dose  . hydrocortisone 2.5 % lotion Apply topically 2 (two) times daily. (Patient not taking: Reported on 07/23/2016) 59 mL 0 Not Taking  . ibuprofen (ADVIL,MOTRIN) 800 MG tablet Take 1 tablet (800 mg total) by mouth every 6 (six) hours as needed for cramping. (Patient not taking: Reported on 07/23/2016) 21 tablet 0 Not Taking  . metroNIDAZOLE (FLAGYL) 500 MG tablet Take 1 tablet (500 mg total) by mouth 2 (two) times daily. One po bid x 7 days (Patient not  taking: Reported on 08/07/2016) 14 tablet 0 Not Taking  . ondansetron (ZOFRAN ODT) 4 MG disintegrating tablet Take 1 tablet (4 mg total) by mouth every 8 (eight) hours as needed for nausea or vomiting. (Patient not taking: Reported on 07/23/2016) 20 tablet 0 Not Taking  . Prenatal Vit-Fe Fumarate-FA (PRENATAL COMPLETE) 14-0.4 MG TABS Take 2 tablets by mouth daily. 60 each 0 Taking   Results for orders placed or performed during the hospital encounter of 09/25/16 (from the past 48 hour(s))  Urinalysis, Routine w reflex microscopic     Status: Abnormal   Collection Time: 09/25/16  9:50 AM  Result Value Ref Range   Color, Urine YELLOW YELLOW   APPearance HAZY (A) CLEAR   Specific Gravity, Urine 1.024 1.005 - 1.030   pH 6.0 5.0 - 8.0   Glucose, UA NEGATIVE NEGATIVE mg/dL   Hgb urine dipstick NEGATIVE NEGATIVE   Bilirubin Urine NEGATIVE NEGATIVE   Ketones, ur NEGATIVE NEGATIVE mg/dL   Protein, ur NEGATIVE NEGATIVE mg/dL   Nitrite NEGATIVE NEGATIVE   Leukocytes, UA SMALL (A) NEGATIVE   RBC / HPF 0-5 0 - 5 RBC/hpf  WBC, UA 0-5 0 - 5 WBC/hpf   Bacteria, UA RARE (A) NONE SEEN   Squamous Epithelial / LPF 6-30 (A) NONE SEEN   Mucous PRESENT   CBC     Status: Abnormal   Collection Time: 09/25/16 10:55 AM  Result Value Ref Range   WBC 8.1 4.0 - 10.5 K/uL    Comment: REPEATED TO VERIFY   RBC 4.02 3.87 - 5.11 MIL/uL   Hemoglobin 11.1 (L) 12.0 - 15.0 g/dL   HCT 16.1 (L) 09.6 - 04.5 %   MCV 82.3 78.0 - 100.0 fL   MCH 27.6 26.0 - 34.0 pg   MCHC 33.5 30.0 - 36.0 g/dL   RDW 40.9 81.1 - 91.4 %   Platelets 209 150 - 400 K/uL    Comment: REPEATED TO VERIFY SPECIMEN CHECKED FOR CLOTS     Review of Systems  Gastrointestinal: Positive for abdominal pain, nausea and vomiting. Negative for constipation and diarrhea.  Genitourinary: Negative for dysuria and vaginal discharge.   Physical Exam   Blood pressure 116/61, pulse 66, temperature 98.6 F (37 C), temperature source Oral, resp. rate 16,  height 5\' 7"  (1.702 m), weight 154 lb (69.9 kg), last menstrual period 05/10/2016.  Physical Exam  Constitutional: She is oriented to person, place, and time. She appears well-developed and well-nourished. No distress.  HENT:  Head: Normocephalic.  Eyes: Pupils are equal, round, and reactive to light.  Neck: Neck supple.  Respiratory: Effort normal.  GI: Soft. She exhibits no distension. There is no tenderness. There is no rebound and no guarding.  Genitourinary:  Genitourinary Comments: Cervix: closed, thick, posterior.   Musculoskeletal: Normal range of motion.  Neurological: She is alert and oriented to person, place, and time.  Skin: Skin is warm. She is not diaphoretic.  Psychiatric: Her behavior is normal.    MAU Course  Procedures  None  MDM  CBC Ibuprofen 600 mg PO, pain down from 3/10 to 1/10.   Assessment and Plan   A:  1. Abdominal pain in pregnancy, second trimester   2. Round ligament pain     P:  Discharge home in stable condition Return to MAU if symptoms worsen  Pregnancy support belt recommended. Ok to use Ibuprofen sparingly in the 2nd trimester only; use as directed on the bottle.   Duane Lope, NP 09/25/2016 1:00 PM

## 2016-09-25 NOTE — Discharge Instructions (Signed)

## 2016-09-25 NOTE — Progress Notes (Addendum)
G1P0 @ 19.[redacted] wksga. Presents to triage for stomach and back pain. States pain started 3 days ago. Denies LOF or bleeding. Last intercourse 3 weeks ago.   Doppler FHT: 158  Provider at bs. Lab order and ibuprofen.   1102: lab at bs. Ibuprofen given

## 2016-09-26 LAB — CULTURE, OB URINE
Culture: <10000 — AB
Special Requests: NORMAL

## 2016-10-02 ENCOUNTER — Ambulatory Visit (INDEPENDENT_AMBULATORY_CARE_PROVIDER_SITE_OTHER): Payer: Medicaid Other | Admitting: Obstetrics & Gynecology

## 2016-10-02 ENCOUNTER — Encounter: Payer: Self-pay | Admitting: Obstetrics

## 2016-10-02 DIAGNOSIS — Z3400 Encounter for supervision of normal first pregnancy, unspecified trimester: Secondary | ICD-10-CM

## 2016-10-02 DIAGNOSIS — Z3402 Encounter for supervision of normal first pregnancy, second trimester: Secondary | ICD-10-CM

## 2016-10-02 NOTE — Progress Notes (Signed)
Pt presents for ROB without complaints today.  

## 2016-10-02 NOTE — Patient Instructions (Signed)
Second Trimester of Pregnancy The second trimester is from week 13 through week 28, month 4 through 6. This is often the time in pregnancy that you feel your best. Often times, morning sickness has lessened or quit. You may have more energy, and you may get hungry more often. Your unborn baby (fetus) is growing rapidly. At the end of the sixth month, he or she is about 9 inches long and weighs about 1 pounds. You will likely feel the baby move (quickening) between 18 and 20 weeks of pregnancy. Follow these instructions at home:  Avoid all smoking, herbs, and alcohol. Avoid drugs not approved by your doctor.  Do not use any tobacco products, including cigarettes, chewing tobacco, and electronic cigarettes. If you need help quitting, ask your doctor. You may get counseling or other support to help you quit.  Only take medicine as told by your doctor. Some medicines are safe and some are not during pregnancy.  Exercise only as told by your doctor. Stop exercising if you start having cramps.  Eat regular, healthy meals.  Wear a good support bra if your breasts are tender.  Do not use hot tubs, steam rooms, or saunas.  Wear your seat belt when driving.  Avoid raw meat, uncooked cheese, and liter boxes and soil used by cats.  Take your prenatal vitamins.  Take 1500-2000 milligrams of calcium daily starting at the 20th week of pregnancy until you deliver your baby.  Try taking medicine that helps you poop (stool softener) as needed, and if your doctor approves. Eat more fiber by eating fresh fruit, vegetables, and whole grains. Drink enough fluids to keep your pee (urine) clear or pale yellow.  Take warm water baths (sitz baths) to soothe pain or discomfort caused by hemorrhoids. Use hemorrhoid cream if your doctor approves.  If you have puffy, bulging veins (varicose veins), wear support hose. Raise (elevate) your feet for 15 minutes, 3-4 times a day. Limit salt in your diet.  Avoid heavy  lifting, wear low heals, and sit up straight.  Rest with your legs raised if you have leg cramps or low back pain.  Visit your dentist if you have not gone during your pregnancy. Use a soft toothbrush to brush your teeth. Be gentle when you floss.  You can have sex (intercourse) unless your doctor tells you not to.  Go to your doctor visits. Get help if:  You feel dizzy.  You have mild cramps or pressure in your lower belly (abdomen).  You have a nagging pain in your belly area.  You continue to feel sick to your stomach (nauseous), throw up (vomit), or have watery poop (diarrhea).  You have bad smelling fluid coming from your vagina.  You have pain with peeing (urination). Get help right away if:  You have a fever.  You are leaking fluid from your vagina.  You have spotting or bleeding from your vagina.  You have severe belly cramping or pain.  You lose or gain weight rapidly.  You have trouble catching your breath and have chest pain.  You notice sudden or extreme puffiness (swelling) of your face, hands, ankles, feet, or legs.  You have not felt the baby move in over an hour.  You have severe headaches that do not go away with medicine.  You have vision changes. This information is not intended to replace advice given to you by your health care provider. Make sure you discuss any questions you have with your health care   provider. Document Released: 09/11/2009 Document Revised: 11/23/2015 Document Reviewed: 08/18/2012 Elsevier Interactive Patient Education  2017 [employer]  

## 2016-10-02 NOTE — Progress Notes (Signed)
   PRENATAL VISIT NOTE  Subjective:  April Barber is a 21 y.o. G1P0 at [redacted]w[redacted]d being seen today for ongoing prenatal care.  She is currently monitored for the following issues for this low-risk pregnancy and has Supervision of normal pregnancy, antepartum and Marijuana use on her problem list.  Patient reports no complaints.  Contractions: Not present. Vag. Bleeding: None.  Movement: Present. Denies leaking of fluid.   The following portions of the patient's history were reviewed and updated as appropriate: allergies, current medications, past family history, past medical history, past social history, past surgical history and problem list. Problem list updated.  Objective:   Vitals:   10/02/16 0903  BP: 110/66  Pulse: 67  Weight: 167 lb 12.8 oz (76.1 kg)    Fetal Status: Fetal Heart Rate (bpm): 155 Fundal Height: 21 cm Movement: Present     General:  Alert, oriented and cooperative. Patient is in no acute distress.  Skin: Skin is warm and dry. No rash noted.   Cardiovascular: Normal heart rate noted  Respiratory: Normal respiratory effort, no problems with respiration noted  Abdomen: Soft, gravid, appropriate for gestational age. Pain/Pressure: Absent     Pelvic:  Cervical exam deferred        Extremities: Normal range of motion.  Edema: None  Mental Status: Normal mood and affect. Normal behavior. Normal judgment and thought content.   Assessment and Plan:  Pregnancy: G1P0 at [redacted]w[redacted]d  1. Supervision of normal first pregnancy, antepartum Weight gain noted and advised to avoid excessive carbs  Preterm labor symptoms and general obstetric precautions including but not limited to vaginal bleeding, contractions, leaking of fluid and fetal movement were reviewed in detail with the patient. Please refer to After Visit Summary for other counseling recommendations.  Return in about 8 weeks (around 11/27/2016).   Adam Phenix, MD

## 2016-11-27 ENCOUNTER — Encounter: Payer: Self-pay | Admitting: Obstetrics

## 2016-11-27 ENCOUNTER — Ambulatory Visit (INDEPENDENT_AMBULATORY_CARE_PROVIDER_SITE_OTHER): Payer: Medicaid Other | Admitting: Certified Nurse Midwife

## 2016-11-27 ENCOUNTER — Encounter: Payer: Self-pay | Admitting: Certified Nurse Midwife

## 2016-11-27 ENCOUNTER — Other Ambulatory Visit: Payer: Medicaid Other

## 2016-11-27 VITALS — BP 109/67 | HR 83 | Wt 187.0 lb

## 2016-11-27 DIAGNOSIS — Z23 Encounter for immunization: Secondary | ICD-10-CM | POA: Diagnosis not present

## 2016-11-27 DIAGNOSIS — Z3400 Encounter for supervision of normal first pregnancy, unspecified trimester: Secondary | ICD-10-CM

## 2016-11-27 DIAGNOSIS — Z3403 Encounter for supervision of normal first pregnancy, third trimester: Secondary | ICD-10-CM

## 2016-11-27 NOTE — Progress Notes (Signed)
Tdap vaccine was given at today's visit. Pt tolerated well.

## 2016-11-27 NOTE — Progress Notes (Signed)
   PRENATAL VISIT NOTE  Subjective:  April Barber is a 21 y.o. G1P0 at 4811w5d being seen today for ongoing prenatal care.  She is currently monitored for the following issues for this low-risk pregnancy and has Supervision of normal pregnancy, antepartum and Marijuana use on her problem list.  Patient reports no complaints.  Contractions: Not present. Vag. Bleeding: None.  Movement: Present. Denies leaking of fluid.   The following portions of the patient's history were reviewed and updated as appropriate: allergies, current medications, past family history, past medical history, past social history, past surgical history and problem list. Problem list updated.  Objective:   Vitals:   11/27/16 0858  BP: 109/67  Pulse: 83  Weight: 187 lb (84.8 kg)    Fetal Status: Fetal Heart Rate (bpm): 152 Fundal Height: 29 cm Movement: Present     General:  Alert, oriented and cooperative. Patient is in no acute distress.  Skin: Skin is warm and dry. No rash noted.   Cardiovascular: Normal heart rate noted  Respiratory: Normal respiratory effort, no problems with respiration noted  Abdomen: Soft, gravid, appropriate for gestational age. Pain/Pressure: Absent     Pelvic:  Cervical exam deferred        Extremities: Normal range of motion.     Mental Status: Normal mood and affect. Normal behavior. Normal judgment and thought content.   Assessment and Plan:  Pregnancy: G1P0 at 23 [redacted]w[redacted]d  1. Supervision of normal first pregnancy, antepartum   57lb weight gain noted,advised on nutritional habits - Glucose Tolerance, 2 Hours w/1 Hour - CBC - HIV antibody - RPR  Preterm labor symptoms and general obstetric precautions including but not limited to vaginal bleeding, contractions, leaking of fluid and fetal movement were reviewed in detail with the patient. Please refer to After Visit Summary for other counseling recommendations.  Return in about 4 weeks (around 12/25/2016) for ROB,  babyscripts.   Roe Coombsachelle A Denney, CNM

## 2016-11-28 ENCOUNTER — Other Ambulatory Visit: Payer: Self-pay | Admitting: Certified Nurse Midwife

## 2016-11-28 DIAGNOSIS — O99013 Anemia complicating pregnancy, third trimester: Secondary | ICD-10-CM | POA: Insufficient documentation

## 2016-11-28 DIAGNOSIS — Z3400 Encounter for supervision of normal first pregnancy, unspecified trimester: Secondary | ICD-10-CM

## 2016-11-28 LAB — CBC
HEMOGLOBIN: 10.5 g/dL — AB (ref 11.1–15.9)
Hematocrit: 31.9 % — ABNORMAL LOW (ref 34.0–46.6)
MCH: 27.1 pg (ref 26.6–33.0)
MCHC: 32.9 g/dL (ref 31.5–35.7)
MCV: 82 fL (ref 79–97)
Platelets: 172 10*3/uL (ref 150–379)
RBC: 3.88 x10E6/uL (ref 3.77–5.28)
RDW: 13.6 % (ref 12.3–15.4)
WBC: 9.6 10*3/uL (ref 3.4–10.8)

## 2016-11-28 LAB — RPR: RPR: NONREACTIVE

## 2016-11-28 LAB — GLUCOSE TOLERANCE, 2 HOURS W/ 1HR
GLUCOSE, 2 HOUR: 109 mg/dL (ref 65–152)
Glucose, 1 hour: 137 mg/dL (ref 65–179)
Glucose, Fasting: 73 mg/dL (ref 65–91)

## 2016-11-28 LAB — HIV ANTIBODY (ROUTINE TESTING W REFLEX): HIV Screen 4th Generation wRfx: NONREACTIVE

## 2016-11-28 MED ORDER — CITRANATAL BLOOM 90-1 MG PO TABS: 1.0000 | ORAL_TABLET | Freq: Every day | ORAL | 12 refills | Status: DC

## 2016-12-05 ENCOUNTER — Other Ambulatory Visit (HOSPITAL_COMMUNITY)
Admission: RE | Admit: 2016-12-05 | Discharge: 2016-12-05 | Disposition: A | Discharge status: Home / Self Care | Payer: Medicaid Other | Source: Ambulatory Visit | Attending: Certified Nurse Midwife | Admitting: Certified Nurse Midwife

## 2016-12-05 ENCOUNTER — Encounter: Payer: Self-pay | Admitting: Obstetrics

## 2016-12-05 ENCOUNTER — Ambulatory Visit (INDEPENDENT_AMBULATORY_CARE_PROVIDER_SITE_OTHER): Payer: Medicaid Other | Admitting: Certified Nurse Midwife

## 2016-12-05 VITALS — BP 112/66 | HR 71 | Wt 191.0 lb

## 2016-12-05 DIAGNOSIS — Z3400 Encounter for supervision of normal first pregnancy, unspecified trimester: Secondary | ICD-10-CM

## 2016-12-05 DIAGNOSIS — B373 Candidiasis of vulva and vagina: Secondary | ICD-10-CM

## 2016-12-05 DIAGNOSIS — O99013 Anemia complicating pregnancy, third trimester: Secondary | ICD-10-CM

## 2016-12-05 DIAGNOSIS — R102 Pelvic and perineal pain: Secondary | ICD-10-CM

## 2016-12-05 DIAGNOSIS — N898 Other specified noninflammatory disorders of vagina: Secondary | ICD-10-CM | POA: Diagnosis not present

## 2016-12-05 DIAGNOSIS — Z3483 Encounter for supervision of other normal pregnancy, third trimester: Secondary | ICD-10-CM

## 2016-12-05 DIAGNOSIS — B3731 Acute candidiasis of vulva and vagina: Secondary | ICD-10-CM

## 2016-12-05 DIAGNOSIS — O26899 Other specified pregnancy related conditions, unspecified trimester: Secondary | ICD-10-CM

## 2016-12-05 DIAGNOSIS — O26893 Other specified pregnancy related conditions, third trimester: Secondary | ICD-10-CM

## 2016-12-05 LAB — POCT URINALYSIS DIPSTICK
BILIRUBIN UA: NEGATIVE
Blood, UA: NEGATIVE
GLUCOSE UA: NEGATIVE
Ketones, UA: NEGATIVE
NITRITE UA: NEGATIVE
Spec Grav, UA: 1.01 (ref 1.010–1.025)
Urobilinogen, UA: 0.2 E.U./dL
pH, UA: 7 (ref 5.0–8.0)

## 2016-12-05 MED ORDER — TERCONAZOLE 0.8 % VA CREA: 1.0000 | TOPICAL_CREAM | Freq: Every day | VAGINAL | 0 refills | Status: DC

## 2016-12-05 MED ORDER — FLUCONAZOLE 150 MG PO TABS: 150.0000 mg | ORAL_TABLET | Freq: Once | ORAL | 0 refills | Status: AC

## 2016-12-05 MED ORDER — COMFORT FIT MATERNITY SUPP LG MISC: 1.0000 [IU] | Freq: Every day | 0 refills | Status: DC

## 2016-12-05 NOTE — Progress Notes (Signed)
Pt states increase in lower pelvic pain / ?baby movements. Pt states she feels she has ?UTI or vaginal infection, would like check today.

## 2016-12-06 LAB — CERVICOVAGINAL ANCILLARY ONLY
Bacterial vaginitis: NEGATIVE
CHLAMYDIA, DNA PROBE: NEGATIVE
Candida vaginitis: POSITIVE — AB
Neisseria Gonorrhea: NEGATIVE
Trichomonas: NEGATIVE

## 2016-12-06 NOTE — Progress Notes (Signed)
   PRENATAL VISIT NOTE  Subjective:  April Barber is a [age] y.o. G1P0 at 297w0d being seen today for ongoing prenatal care.  She is currently monitored for the following issues for this low-risk pregnancy and has Supervision of normal pregnancy, antepartum; Marijuana use; and Anemia affecting pregnancy in third trimester on her problem list.  Patient reports no bleeding, no contractions, no cramping, no leaking and vaginal irritation.  Contractions: Not present. Vag. Bleeding: None.  Movement: Present. Denies leaking of fluid.   The following portions of the patient's history were reviewed and updated as appropriate: allergies, current medications, past family history, past medical history, past social history, past surgical history and problem list. Problem list updated.  Objective:   Vitals:   12/05/16 1032  BP: 112/66  Pulse: 71  Weight: 191 lb (86.6 kg)    Fetal Status: Fetal Heart Rate (bpm): 147 Fundal Height: 30 cm Movement: Present  Presentation: Vertex  General:  Alert, oriented and cooperative. Patient is in no acute distress.  Skin: Skin is warm and dry. No rash noted.   Cardiovascular: Normal heart rate noted  Respiratory: Normal respiratory effort, no problems with respiration noted  Abdomen: Soft, gravid, appropriate for gestational age. Pain/Pressure: Present     Pelvic:  Cervical exam deferred Dilation: Closed Effacement (%): Thick Station: Ballotable  Extremities: Normal range of motion.     Mental Status: Normal mood and affect. Normal behavior. Normal judgment and thought content.   Assessment and Plan:  Pregnancy: G1P0 at 197w0d  1. Vaginal irritation      - Cervicovaginal ancillary only - POCT Urinalysis Dipstick  2. Supervision of normal first pregnancy, antepartum        3. Anemia affecting pregnancy in third trimester     Taking OTC iron.   4. Pain of round ligament affecting pregnancy, antepartum      - Elastic Bandages & Supports (COMFORT FIT  MATERNITY SUPP LG) MISC; 1 Units by Does not apply route daily.  Dispense: 1 each; Refill: 0  5. Yeast infection of the vagina     - fluconazole (DIFLUCAN) 150 MG tablet; Take 1 tablet (150 mg total) by mouth once.  Dispense: 1 tablet; Refill: 0 - terconazole (TERAZOL 3) 0.8 % vaginal cream; Place 1 applicator vaginally at bedtime.  Dispense: 20 g; Refill: 0  Preterm labor symptoms and general obstetric precautions including but not limited to vaginal bleeding, contractions, leaking of fluid and fetal movement were reviewed in detail with the patient. Please refer to After Visit Summary for other counseling recommendations.  Return in about 3 weeks (around 12/26/2016) for ROB, babyscripts around 32 weeks.   Roe Coombsachelle A Nalaya Wojdyla, CNM

## 2016-12-09 ENCOUNTER — Other Ambulatory Visit: Payer: Self-pay | Admitting: Certified Nurse Midwife

## 2016-12-25 ENCOUNTER — Ambulatory Visit (INDEPENDENT_AMBULATORY_CARE_PROVIDER_SITE_OTHER): Payer: Medicaid Other | Admitting: Certified Nurse Midwife

## 2016-12-25 ENCOUNTER — Encounter: Payer: Self-pay | Admitting: Certified Nurse Midwife

## 2016-12-25 ENCOUNTER — Encounter: Payer: Self-pay | Admitting: Obstetrics

## 2016-12-25 VITALS — BP 120/72 | HR 75 | Wt 199.0 lb

## 2016-12-25 DIAGNOSIS — Z3400 Encounter for supervision of normal first pregnancy, unspecified trimester: Secondary | ICD-10-CM

## 2016-12-25 DIAGNOSIS — O99013 Anemia complicating pregnancy, third trimester: Secondary | ICD-10-CM

## 2016-12-25 DIAGNOSIS — Z3403 Encounter for supervision of normal first pregnancy, third trimester: Secondary | ICD-10-CM

## 2016-12-25 NOTE — Progress Notes (Signed)
   PRENATAL VISIT NOTE  Subjective:  April Barber is a 21 y.o. G1P0 at 9179w5d being seen today for ongoing prenatal care.  She is currently monitored for the following issues for this low-risk pregnancy and has Supervision of normal pregnancy, antepartum; Marijuana use; and Anemia affecting pregnancy in third trimester on her problem list.  Patient reports backache, fatigue, no bleeding, no contractions, no cramping and no leaking.  Contractions: Not present. Vag. Bleeding: None.  Movement: Present. Denies leaking of fluid. States that she has not picked up maternity support belt yet: has not had time and has Rx previously written.  States that she is not able to sit down while at school without a note.  Note written.    The following portions of the patient's history were reviewed and updated as appropriate: allergies, current medications, past family history, past medical history, past social history, past surgical history and problem list. Problem list updated.  Objective:   Vitals:   12/25/16 0914  BP: 120/72  Pulse: 75  Weight: 199 lb (90.3 kg)    Fetal Status: Fetal Heart Rate (bpm): 152 Fundal Height: 32 cm Movement: Present     General:  Alert, oriented and cooperative. Patient is in no acute distress.  Skin: Skin is warm and dry. No rash noted.   Cardiovascular: Normal heart rate noted  Respiratory: Normal respiratory effort, no problems with respiration noted  Abdomen: Soft, gravid, appropriate for gestational age. Pain/Pressure: Absent     Pelvic:  Cervical exam deferred        Extremities: Normal range of motion.  Edema: None  Mental Status: Normal mood and affect. Normal behavior. Normal judgment and thought content.   Assessment and Plan:  Pregnancy: G1P0 at 7379w5d  1. Supervision of normal first pregnancy, antepartum      Encouraged to take vitamins and pick up maternity support belt.  Note written for breaks/sitting for school.    2. Anemia affecting pregnancy in  third trimester       Preterm labor symptoms and general obstetric precautions including but not limited to vaginal bleeding, contractions, leaking of fluid and fetal movement were reviewed in detail with the patient. Please refer to After Visit Summary for other counseling recommendations.  Return in about 4 weeks (around 01/22/2017) for ROB, GBS.   Roe Coombsachelle A Jasai Sorg, CNM

## 2017-01-19 ENCOUNTER — Inpatient Hospital Stay (HOSPITAL_COMMUNITY)
Admission: AD | Admit: 2017-01-19 | Discharge: 2017-01-21 | DRG: 775 | Disposition: A | Discharge status: Home / Self Care | Payer: Medicaid Other | Source: Ambulatory Visit | Attending: Obstetrics & Gynecology | Admitting: Obstetrics & Gynecology

## 2017-01-19 ENCOUNTER — Encounter (HOSPITAL_COMMUNITY): Payer: Self-pay

## 2017-01-19 DIAGNOSIS — Z87891 Personal history of nicotine dependence: Secondary | ICD-10-CM

## 2017-01-19 DIAGNOSIS — F1290 Cannabis use, unspecified, uncomplicated: Secondary | ICD-10-CM | POA: Diagnosis present

## 2017-01-19 DIAGNOSIS — O9902 Anemia complicating childbirth: Secondary | ICD-10-CM | POA: Diagnosis present

## 2017-01-19 DIAGNOSIS — O99013 Anemia complicating pregnancy, third trimester: Secondary | ICD-10-CM

## 2017-01-19 DIAGNOSIS — Z3A36 36 weeks gestation of pregnancy: Secondary | ICD-10-CM

## 2017-01-19 DIAGNOSIS — O99324 Drug use complicating childbirth: Secondary | ICD-10-CM | POA: Diagnosis present

## 2017-01-19 DIAGNOSIS — D649 Anemia, unspecified: Secondary | ICD-10-CM | POA: Diagnosis present

## 2017-01-19 DIAGNOSIS — O4290 Premature rupture of membranes, unspecified as to length of time between rupture and onset of labor, unspecified weeks of gestation: Secondary | ICD-10-CM | POA: Diagnosis present

## 2017-01-19 DIAGNOSIS — Z3400 Encounter for supervision of normal first pregnancy, unspecified trimester: Secondary | ICD-10-CM

## 2017-01-19 DIAGNOSIS — O42913 Preterm premature rupture of membranes, unspecified as to length of time between rupture and onset of labor, third trimester: Principal | ICD-10-CM | POA: Diagnosis present

## 2017-01-19 LAB — ABO/RH: ABO/RH(D): O POS

## 2017-01-19 LAB — TYPE AND SCREEN
ABO/RH(D): O POS
Antibody Screen: NEGATIVE

## 2017-01-19 LAB — CBC
HCT: 32.5 % — ABNORMAL LOW (ref 36.0–46.0)
Hemoglobin: 10.6 g/dL — ABNORMAL LOW (ref 12.0–15.0)
MCH: 26.4 pg (ref 26.0–34.0)
MCHC: 32.6 g/dL (ref 30.0–36.0)
MCV: 80.8 fL (ref 78.0–100.0)
Platelets: 157 K/uL (ref 150–400)
RBC: 4.02 MIL/uL (ref 3.87–5.11)
RDW: 13.9 % (ref 11.5–15.5)
WBC: 8.8 K/uL (ref 4.0–10.5)

## 2017-01-19 MED ORDER — FENTANYL CITRATE (PF) 100 MCG/2ML IJ SOLN
50.0000 ug | INTRAMUSCULAR | Status: DC | PRN
  Administered 2017-01-19: 50 ug via INTRAVENOUS
  Filled 2017-01-19: qty 2

## 2017-01-19 MED ORDER — LACTATED RINGERS IV SOLN: 500.0000 mL | INTRAVENOUS | Status: DC | PRN

## 2017-01-19 MED ORDER — TETANUS-DIPHTH-ACELL PERTUSSIS 5-2.5-18.5 LF-MCG/0.5 IM SUSP: 0.5000 mL | Freq: Once | INTRAMUSCULAR | Status: DC

## 2017-01-19 MED ORDER — OXYTOCIN BOLUS FROM INFUSION: 500.0000 mL | Freq: Once | INTRAVENOUS | Status: DC

## 2017-01-19 MED ORDER — OXYTOCIN 40 UNITS IN LACTATED RINGERS INFUSION - SIMPLE MED
INTRAVENOUS | Status: AC
  Administered 2017-01-19: 11:00:00
  Filled 2017-01-19: qty 1000

## 2017-01-19 MED ORDER — ACETAMINOPHEN 325 MG PO TABS: 650.0000 mg | ORAL_TABLET | ORAL | Status: DC | PRN

## 2017-01-19 MED ORDER — COCONUT OIL OIL: 1.0000 "application " | TOPICAL_OIL | Status: DC | PRN

## 2017-01-19 MED ORDER — PENICILLIN G POTASSIUM 5000000 UNITS IJ SOLR
5.0000 10*6.[IU] | Freq: Once | INTRAMUSCULAR | Status: AC
  Administered 2017-01-19: 5 10*6.[IU] via INTRAVENOUS
  Filled 2017-01-19: qty 5

## 2017-01-19 MED ORDER — WITCH HAZEL-GLYCERIN EX PADS: 1.0000 "application " | MEDICATED_PAD | CUTANEOUS | Status: DC | PRN

## 2017-01-19 MED ORDER — MEASLES, MUMPS & RUBELLA VAC ~~LOC~~ INJ
0.5000 mL | INJECTION | Freq: Once | SUBCUTANEOUS | Status: DC
  Filled 2017-01-19: qty 0.5

## 2017-01-19 MED ORDER — ONDANSETRON HCL 4 MG/2ML IJ SOLN
4.0000 mg | Freq: Four times a day (QID) | INTRAMUSCULAR | Status: DC | PRN
  Administered 2017-01-19: 4 mg via INTRAVENOUS
  Filled 2017-01-19: qty 2

## 2017-01-19 MED ORDER — ONDANSETRON HCL 4 MG PO TABS: 4.0000 mg | ORAL_TABLET | ORAL | Status: DC | PRN

## 2017-01-19 MED ORDER — LIDOCAINE HCL (PF) 1 % IJ SOLN
INTRAMUSCULAR | Status: AC
  Filled 2017-01-19: qty 30

## 2017-01-19 MED ORDER — SODIUM CHLORIDE 0.9% FLUSH: 3.0000 mL | INTRAVENOUS | Status: DC | PRN

## 2017-01-19 MED ORDER — SOD CITRATE-CITRIC ACID 500-334 MG/5ML PO SOLN: 30.0000 mL | ORAL | Status: DC | PRN

## 2017-01-19 MED ORDER — ONDANSETRON HCL 4 MG/2ML IJ SOLN: 4.0000 mg | INTRAMUSCULAR | Status: DC | PRN

## 2017-01-19 MED ORDER — LIDOCAINE HCL (PF) 1 % IJ SOLN: 30.0000 mL | INTRAMUSCULAR | Status: DC | PRN

## 2017-01-19 MED ORDER — DIBUCAINE 1 % RE OINT: 1.0000 "application " | TOPICAL_OINTMENT | RECTAL | Status: DC | PRN

## 2017-01-19 MED ORDER — SIMETHICONE 80 MG PO CHEW: 80.0000 mg | CHEWABLE_TABLET | ORAL | Status: DC | PRN

## 2017-01-19 MED ORDER — IBUPROFEN 600 MG PO TABS
600.0000 mg | ORAL_TABLET | Freq: Four times a day (QID) | ORAL | Status: DC
  Administered 2017-01-19 – 2017-01-21 (×8): 600 mg via ORAL
  Filled 2017-01-19 (×8): qty 1

## 2017-01-19 MED ORDER — OXYTOCIN 40 UNITS IN LACTATED RINGERS INFUSION - SIMPLE MED: 2.5000 [IU]/h | INTRAVENOUS | Status: DC

## 2017-01-19 MED ORDER — PRENATAL MULTIVITAMIN CH
1.0000 | ORAL_TABLET | Freq: Every day | ORAL | Status: DC
  Administered 2017-01-20 – 2017-01-21 (×2): 1 via ORAL
  Filled 2017-01-19 (×2): qty 1

## 2017-01-19 MED ORDER — BENZOCAINE-MENTHOL 20-0.5 % EX AERO: 1.0000 "application " | INHALATION_SPRAY | CUTANEOUS | Status: DC | PRN

## 2017-01-19 MED ORDER — SODIUM CHLORIDE 0.9 % IV SOLN: 250.0000 mL | INTRAVENOUS | Status: DC | PRN

## 2017-01-19 MED ORDER — LACTATED RINGERS IV SOLN
INTRAVENOUS | Status: DC
  Administered 2017-01-19: 10:00:00 via INTRAVENOUS

## 2017-01-19 MED ORDER — DIPHENHYDRAMINE HCL 25 MG PO CAPS: 25.0000 mg | ORAL_CAPSULE | Freq: Four times a day (QID) | ORAL | Status: DC | PRN

## 2017-01-19 MED ORDER — SODIUM CHLORIDE 0.9% FLUSH: 3.0000 mL | Freq: Two times a day (BID) | INTRAVENOUS | Status: DC

## 2017-01-19 MED ORDER — SENNOSIDES-DOCUSATE SODIUM 8.6-50 MG PO TABS
2.0000 | ORAL_TABLET | ORAL | Status: DC
  Administered 2017-01-19 – 2017-01-20 (×2): 2 via ORAL
  Filled 2017-01-19 (×2): qty 2

## 2017-01-19 MED ORDER — PENICILLIN G POT IN DEXTROSE 60000 UNIT/ML IV SOLN: 3.0000 10*6.[IU] | INTRAVENOUS | Status: DC

## 2017-01-19 MED ORDER — ZOLPIDEM TARTRATE 5 MG PO TABS: 5.0000 mg | ORAL_TABLET | Freq: Every evening | ORAL | Status: DC | PRN

## 2017-01-19 NOTE — MAU Note (Signed)
Moved to BS with Thressa ShellerHeather Hogan CNM

## 2017-01-19 NOTE — H&P (Signed)
LABOR AND DELIVERY ADMISSION HISTORY AND PHYSICAL NOTE  April Barber is a 21 y.o. female G1P0 with IUP at [redacted]w[redacted]d by LMP presenting for preterm labor, preterm premature rupture of membranes, prolonged rupture of membranes.   She reports positive fetal movement. She admits to rush of fluid at 10am the day prior to presentation. Denies vaginal bleeding, chest pain, shortness of breath, headache, vision change, RUQ pain.   Prenatal History/Complications:   Past Medical History: Past Medical History:  Diagnosis Date  . Medical history non-contributory     Past Surgical History: Past Surgical History:  Procedure Laterality Date  . NO PAST SURGERIES      Obstetrical History: OB History    Gravida Para Term Preterm AB Living   1             SAB TAB Ectopic Multiple Live Births                  Social History: Social History   Social History  . Marital status: Single    Spouse name: N/A  . Number of children: N/A  . Years of education: N/A   Social History Main Topics  . Smoking status: Former Smoker    Types: Cigars  . Smokeless tobacco: Never Used     Comment: 2 cigars per day   . Alcohol use Yes     Comment: occ  . Drug use: No  . Sexual activity: Not Asked   Other Topics Concern  . None   Social History Narrative  . None    Family History: Family History  Problem Relation Age of Onset  . Hypertension Mother   . Diabetes Maternal Uncle     Allergies: No Known Allergies  Prescriptions Prior to Admission  Medication Sig Dispense Refill Last Dose  . Elastic Bandages & Supports (COMFORT FIT MATERNITY SUPP LG) MISC 1 Units by Does not apply route daily. (Patient not taking: Reported on 12/25/2016) 1 each 0 Not Taking  . Prenatal Vit-Fe Fumarate-FA (PRENATAL COMPLETE) 14-0.4 MG TABS Take 2 tablets by mouth daily. (Patient not taking: Reported on 12/25/2016) 60 each 0 Not Taking  . Prenatal-DSS-FeCb-FeGl-FA (CITRANATAL BLOOM) 90-1 MG TABS Take 1 tablet by  mouth daily. (Patient not taking: Reported on 12/25/2016) 30 tablet 12 Not Taking     Review of Systems   All systems reviewed and negative except as stated in HPI  Blood pressure (!) 147/83, pulse 82, temperature 98.1 F (36.7 C), temperature source Oral, resp. rate 18, last menstrual period 05/10/2016, SpO2 98 %. General appearance: alert, cooperative and appears stated age Lungs: clear to auscultation bilaterally Heart: regular rate and rhythm Abdomen: soft, non-tender; bowel sounds normal Extremities: No calf swelling or tenderness Presentation: cephalic Fetal monitoring: 140/mod/+ac/+variable decelerations with contraction, immediate recovery Uterine activity: q3 minutes Dilation: 3.5 Effacement (%): 90 Station: -1 Exam by:: Ginnie Smart RN   Prenatal labs: ABO, Rh: O/Positive/-- (01/23 1510) Antibody: Negative (01/23 1510) Rubella: Immune RPR: Non Reactive (05/30 1050)  HBsAg: Negative (01/23 1510)  HIV:   non-reactive GBS:   Unknown 1 hr Glucola: Normal (73/137/103) Genetic screening:  Normal Anatomy US: Normal  Prenatal Transfer Tool  Maternal Diabetes: No Genetic Screening: Normal Maternal Ultrasounds/Referrals: Normal Fetal Ultrasounds or other Referrals:  None Maternal Substance Abuse:  Yes:  Type: Marijuana Significant Maternal Medications:  None Significant Maternal Lab Results: None  No results found for this or any previous visit (from the past 24 hour(s)).  Patient Active Problem List  Diagnosis Date Noted  . SPROM (prolonged spontaneous rupture of membranes) 01/19/2017  . Preterm labor 01/19/2017  . Anemia affecting pregnancy in third trimester 11/28/2016  . Marijuana use 08/06/2016  . Supervision of normal pregnancy, antepartum 07/23/2016    Assessment: April Barber is a 21 y.o. G1P0 at 4349w2d here for PPROM, active labor  #Labor: Expectant management #Pain: Labor support #FWB: Cat 1, variables likely related to mild cord compression  with contractions. #ID: GBS unknown #MOF: Breast #MOC: Pills #Circ: Yes, outpatient  Conard NovakJoshua A Addelynn Batte 01/19/2017, 10:31 AM

## 2017-01-19 NOTE — Lactation Note (Signed)
This note was copied from a baby's chart. Lactation Consultation Note  Baby 9 hours old.  3348w2d.  P1. Discussed LPI feeding plan. Mother pumped approx 5 ml w/ DEBP.  Demonstrated hands on pumping. Assisted w/ latching baby in cross cradle hold.  Baby latches easily. During feeding baby came off and on breast.  Noted baby has good tongue protrusion. Mother asked to give volume back to baby with bottle. Baby seemed to push nipple out of mouth. Demonstrated how to finger syringe feed. Baby easily sucked volume from syringe. Reviewed  Cleaning and milk storage and encouraged parents to read LPI information sheet. Mom made aware of O/P services, breastfeeding support groups, community resources, and our phone # for post-discharge questions.  Plan Mom encouraged to feed baby 8-12 times/24 hours and with feeding cues at least q 3hours. If baby latches, feeding with supplementation can take up to 30 min. Post pump after feedings and give volume back to baby at next feeding. Hand express before feedings and before pumping.        Patient Name: April Barber HYQMV'HToday's Date: 01/19/2017 Reason for consult: Late preterm infant   Maternal Data Has patient been taught Hand Expression?: Yes  Feeding Feeding Type: Breast Fed Length of feed: 10 min (off and on)  LATCH Score/Interventions Latch: Grasps breast easily, tongue down, lips flanged, rhythmical sucking. Intervention(s): Waking techniques;Teach feeding cues  Audible Swallowing: A few with stimulation Intervention(s): Hand expression  Type of Nipple: Everted at rest and after stimulation  Comfort (Breast/Nipple): Soft / non-tender     Hold (Positioning): Assistance needed to correctly position infant at breast and maintain latch.  LATCH Score: 8  Lactation Tools Discussed/Used     Consult Status Consult Status: Follow-up Date: 01/20/17 Follow-up type: In-patient    April Barber, April Barber [facility] 01/19/2017, 10:20  PM

## 2017-01-20 NOTE — Progress Notes (Signed)
Post Partum Day #1 Subjective: no complaints, up ad lib and tolerating PO; breastfeeding/hand expression going well; unsure re pp contraception  Objective: Blood pressure 114/62, pulse 74, temperature 98 F (36.7 C), temperature source Oral, resp. rate 18, height 5\' 7"  (1.702 m), weight 81.6 kg (180 lb), last menstrual period 05/10/2016, SpO2 99 %, unknown if currently breastfeeding.  Physical Exam:  General: alert, cooperative and no distress Lochia: appropriate Uterine Fundus: firm DVT Evaluation: No evidence of DVT seen on physical exam.   Recent Labs  01/19/17 1024  HGB 10.6*  HCT 32.5*    Assessment/Plan: Plan for discharge tomorrow  Baby may need to stay longer due to [redacted]wk GA   LOS: 1 day   Aldwin Micalizzi CNM 01/20/2017, 8:11 AM

## 2017-01-20 NOTE — Progress Notes (Signed)
UR chart review completed.  

## 2017-01-20 NOTE — Progress Notes (Signed)
CSW received consult for hx of marijuana use.  Referral was screened out due to the following: ~MOB had no documented substance use after initial prenatal visit/+UPT. ~MOB had no positive drug screens after initial prenatal visit/+UPT. ~Baby's UDS is negative.  Please consult CSW if current concerns arise or by MOB's request.  CSW will monitor CDS results and make report to Child Protective Services if warranted. 

## 2017-01-20 NOTE — Lactation Note (Signed)
This note was copied from a baby's chart. Lactation Consultation Note  Patient Name: Boy Hassell HalimJada Leitzke EAVWU'JToday's Date: 01/20/2017 Reason for consult: Follow-up assessment;Late preterm infant   Follow up with mom of 30 hour old LPT infant. Infant with 2 BF for 10 minutes, 3 attempts, EBM x 5 of 1-5 cc via spoon, 4 voids and 4 stools in last 24 hours. LATCH scores 8. Infant weight 6 lb 1.5 oz with 3% weight loss since birth.    Infant in mom's lap, mom reports infant would not latch to the breast and she hand expressed and spoon fed infant " 2 1/2 spoon fulls" mom then clarified after questioning that infant had about 5 cc EBM. She reports infant is having increasing difficulty latching to the breast as he will not open mouth well. Reviewed LPT infant sheet and advised mom that infant now needs 10-20 ml/feeding and when he is 1748 hours old he needs to increase to 20-30 ml. Discussed with mom that if not able to express 10-20 ml, that formula may need to be used until her milk comes in. Discussed energy levels of LPT infant and importance of calories at this time to keep energy levels up.   Mom reports she has pumped once today. She reports her breasts are feeling fuller today. Reviewed importance of pumping every 3 hours to stimulate milk production and to obtain supplement for infant. Reviewed LPT infant sheet and supplement amounts with mom. Reviewed pump setting and pumping for 15 minutes on Initiate setting. Enc mom to pump now since infant is asleep and use with next feeding. Reviewed not allowing infant to go longer than 3 hours between feeds.   [facility].   Plan written on board: Breast feed infant at first feeding cues with no longer than 3 hours between feeds Supplement with EBM at least 10-20 ml, may have to use formula if mom not able to express enough EBM Pump for 15 minutes with DEBP on Initiate setting  Hand express post pumping Call for assistance as  needed.    Maternal Data Formula Feeding for Exclusion: No Has patient been taught Hand Expression?: Yes Does the patient have breastfeeding experience prior to this delivery?: No  Feeding Feeding Type: Breast Fed  LATCH Score/Interventions Latch: Repeated attempts needed to sustain latch, nipple held in mouth throughout feeding, stimulation needed to elicit sucking reflex. Intervention(s): Waking techniques;Teach feeding cues Intervention(s): Adjust position;Assist with latch  Audible Swallowing: A few with stimulation  Type of Nipple: Everted at rest and after stimulation  Comfort (Breast/Nipple): Soft / non-tender     Hold (Positioning): No assistance needed to correctly position infant at breast. Intervention(s): Breastfeeding basics reviewed;Support Pillows;Position options;Skin to skin  LATCH Score: 8  Lactation Tools Discussed/Used Pump Review: Setup, frequency, and cleaning;Milk Storage Initiated by:: Reviewed and encouraged at least every 3 hours   Consult Status Consult Status: Follow-up Date: 01/21/17 Follow-up type: In-patient    Silas FloodSharon S Shiv Shuey 01/20/2017, 6:20 PM

## 2017-01-21 ENCOUNTER — Ambulatory Visit: Payer: Self-pay

## 2017-01-21 LAB — RPR: RPR Ser Ql: NONREACTIVE

## 2017-01-21 MED ORDER — PRENATAL MULTIVITAMIN CH: 1.0000 | ORAL_TABLET | Freq: Every day | ORAL | 0 refills | Status: DC

## 2017-01-21 MED ORDER — IBUPROFEN 600 MG PO TABS: 600.0000 mg | ORAL_TABLET | Freq: Four times a day (QID) | ORAL | 0 refills | Status: DC

## 2017-01-21 NOTE — Lactation Note (Signed)
This note was copied from a baby's chart. Lactation Consultation Note  Patient Name: April Barber ZOXWR'UToday's Date: 01/21/2017  Follow up appointment due to engorgement.  Mom can't tell me how often she is pumping.  Her last pumping was 2 hours ago.  Both breasts moderate engorgement.  Mom has been using ice packs.  Instructed mom to begin pumping every 2 hours on standard setting and ice for 30 minutes every 2 hours.  Assisted mom with pumping with hands on massage.  Milk is flowing freely.  Mom pumped 30 mls from each breast and some softening noted.  New ice packs given to mom.  Reviewed expressed milk storage.   Maternal Data    Feeding    LATCH Score/Interventions                      Lactation Tools Discussed/Used     Consult Status      April Barber, [name] 01/21/2017, 6:26 PM

## 2017-01-21 NOTE — Lactation Note (Addendum)
This note was copied from a baby's chart. Lactation Consultation Note  Patient Name: April Hassell HalimJada Bribiesca ZOXWR'UToday's Date: 01/21/2017 Reason for consult: Follow-up assessment;Difficult latch;Infant < 6lbs;Late preterm infant   Follow up with mom of 7945 hour old LPT infant. Infant with 2 BF for 10 minutes, 2 attempts, 5 bottles EBM of 12-20 ml, 4 voids and 2 stools in last 24 hours.   Infant has not been latching well in the last 24 hours. Mom has been pumping every 2-3 hours and volumes are increasing. She has been feeding infant all EBM that is pumped. Mom is engorged this morning, Ice packs given to apply for 20 minutes prior to each pumping/feeding.   Enc mom to continue offering breast with each feeding followed by offering infant supplement as listed on the LPT infant sheet. Reviewed supplementation guidelines with mom.   Mom does not have a pump at home. [facility]. Explained to mom about WIC pump loaner program, she is going go to see if she can get some money to rent before d/c.   [facility] and mom attempted to latch him. He was noted to have deep cheek dimpling and tongue sucking that was not resolved with deepened latch and positioning. Placed # 20 NS to the left breast and infant was relatched. Infant was able to feed with some minimal cheek dimpling. He is noted to have clicking with suckling with and without the NS. He is a sleepy feeder and needed stimulation to continue feeding. Mom massaged breast with feeding. He was noted to have some swallows when feeding with the NS. The breast softened some near the nipple area and milk was in the NS when infant came off. Infant was asleep on mom's chest post feeding. Enc mom to still offer supplement after BF.   Mom without further questions/concerns at this time. Report to Vivi MartensAshley Billings, RN. Praised mom for her hard work pumping and feeding infant last night.     Maternal Data Formula Feeding for Exclusion: No Has patient been taught Hand Expression?: Yes  Feeding Feeding Type: Breast Fed Nipple Type: Slow - flow Length of feed: 15 min  LATCH Score/Interventions Latch: Repeated attempts needed to sustain latch, nipple held in mouth throughout feeding, stimulation needed to elicit sucking reflex. Intervention(s): Teach feeding cues;Waking techniques;Skin to skin Intervention(s): Adjust position;Assist with latch;Breast massage;Breast compression  Audible Swallowing: Spontaneous and intermittent Intervention(s): Alternate breast massage;Hand expression;Skin to skin  Type of Nipple: Everted at rest and after stimulation  Comfort (Breast/Nipple): Engorged, cracked, bleeding, large blisters, severe discomfort Problem noted: Engorgment Intervention(s): Ice;Hand expression     Hold (Positioning): No assistance needed to correctly position infant at breast. Intervention(s): Breastfeeding basics reviewed;Support Pillows;Position options;Skin to skin  LATCH Score: 7  Lactation Tools Discussed/Used Tools: Nipple Shields Nipple shield size: 20 Breast pump type: Double-Electric Breast Pump WIC Program: Yes Pump Review: Setup, frequency, and cleaning;Milk Storage Initiated by:: Reviewed and encouraged   Consult Status Consult Status: Follow-up Date: 01/22/17 Follow-up type: In-patient    Silas FloodSharon S Sai Zinn 01/21/2017, 9:24 AM

## 2017-01-21 NOTE — Discharge Instructions (Signed)

## 2017-01-21 NOTE — Discharge Summary (Signed)
OB Discharge Summary     Patient Name: April Barber DOB: [birth year] MRN: [id number]  Date of admission: 01/19/2017 Delivering MD: April Barber   Date of discharge: 01/21/2017  Admitting diagnosis: 36WKS CONTRACTIONS WATER BROKE Intrauterine pregnancy: [redacted]w[redacted]Barber     Secondary diagnosis:  Principal Problem:   Preterm labor in third trimester with preterm delivery Active Problems:   SPROM (prolonged spontaneous rupture of membranes)   Preterm labor  Additional problems:  Patient Active Problem List   Diagnosis Date Noted  . SPROM (prolonged spontaneous rupture of membranes) 01/19/2017  . Preterm labor 01/19/2017  . Preterm labor in third trimester with preterm delivery 01/19/2017  . Anemia affecting pregnancy in third trimester 11/28/2016  . Marijuana use 08/06/2016  . Supervision of normal pregnancy, antepartum 07/23/2016       Discharge diagnosis: Preterm Pregnancy Delivered                                                                                                Post partum procedures:none  Augmentation: none  Complications: None  Hospital course:  Onset of Labor With Vaginal Delivery     21 y.o. yo G1P0101 at [redacted]w[redacted]Barber was admitted in Active Labor on 01/19/2017. Patient had an uncomplicated labor course as follows:  Membrane Rupture Time/Date: 10:00 AM ,01/18/2017   Intrapartum Procedures: Episiotomy: None [1]                                         Lacerations:  None [1]  Patient had Barber delivery of Barber Viable infant. 01/19/2017  Information for the patient's newborn:  April Barber, April Barber [[id number]]  Delivery Method: Vaginal, Spontaneous Delivery (Filed from Delivery Summary)    Pateint had an uncomplicated postpartum course.  She is ambulating, tolerating Barber regular diet, passing flatus, and urinating well. Patient is discharged home in stable condition on 01/21/17.   Physical exam  Vitals:   01/20/17 0130 01/20/17 0638 01/20/17 1838 01/21/17 0636  BP:  116/72 114/62 123/60 118/64  Pulse: 68 74 60 71  Resp: 18 18 18 18   Temp: 98.4 F (36.9 C) 98 F (36.7 C) 98.6 F (37 C) 97.9 F (36.6 C)  TempSrc: Oral Oral Oral Oral  SpO2: 100% 99%    Weight:      Height:       General: alert, cooperative and no distress Lochia: appropriate Uterine Fundus: firm Incision: N/Barber DVT Evaluation: No evidence of DVT seen on physical exam. Negative Homan's sign. No significant calf/ankle edema. Labs: Lab Results  Component Value Date   WBC 8.8 01/19/2017   HGB 10.6 (L) 01/19/2017   HCT 32.5 (L) 01/19/2017   MCV 80.8 01/19/2017   PLT 157 01/19/2017   CMP Latest Ref Rng & Units 07/03/2016  Glucose 65 - 99 mg/dL 82  BUN 6 - 20 mg/dL 7  Creatinine 7.82 - 9.56 mg/dL 2.13  Sodium 086 - 578 mmol/L 138  Potassium 3.5 - 5.1 mmol/L 3.5  Chloride 101 - 111 mmol/L  101  CO2 22 - 32 mmol/L 22  Calcium 8.9 - 10.3 mg/dL 9.5  Total Protein 6.5 - 8.1 g/dL 7.5  Total Bilirubin 0.3 - 1.2 mg/dL 0.9  Alkaline Phos 38 - 126 U/L 49  AST 15 - 41 U/L 21  ALT 14 - 54 U/L 19    Discharge instruction: per After Visit Summary and "Baby and Me Booklet".  After visit meds:  Allergies as of 01/21/2017   No Known Allergies     Medication List    TAKE these medications   ibuprofen 600 MG tablet Commonly known as:  ADVIL,MOTRIN Take 1 tablet (600 mg total) by mouth every 6 (six) hours.   prenatal multivitamin Tabs tablet Take 1 tablet by mouth daily at 12 noon.       Diet: routine diet  Activity: Advance as tolerated. Pelvic rest for 6 weeks.   Outpatient follow up:4-6 weeks Follow up Appt:Future Appointments Date Time Provider Department Center  02/17/2017 10:30 AM April Barber, April Barber, April Barber [facility] None   Follow up Visit:No Follow-up on file.  Postpartum contraception: IUD    Newborn Data: Live born female  Birth Weight: 6 lb 4.7 oz (2855 g) APGAR: 7, 9  Baby Feeding: Bottle and Breast Disposition:rooming in   01/21/2017 [place] Shirley, DO    April Barber attestation I have seen and examined this patient and agree with above documentation in the resident's note.   April HalimJada Barber is Barber 21 y.o. G1P0101 s/p preterm SVD.   Pain is well controlled.  Plan for birth control is IUD.  Method of Feeding: both  PE:  BP 118/64 (BP Location: Right Arm)   Pulse 71   Temp 97.9 F (36.6 C) (Oral)   Resp 18   Ht 5\' 7"  (1.702 m)   Wt 81.6 kg (180 lb)   LMP 05/10/2016   SpO2 99%   Breastfeeding? Unknown   BMI 28.19 kg/m  Fundus firm   Recent Labs  01/19/17 1024  HGB 10.6*  HCT 32.5*     Plan: discharge today - postpartum care discussed - f/u clinic in 4 weeks for postpartum visit   April HaiSHAW, April Barber, April Barber 8:36 AM  01/21/2017

## 2017-01-22 ENCOUNTER — Encounter: Payer: Medicaid Other | Admitting: Certified Nurse Midwife

## 2017-01-22 ENCOUNTER — Ambulatory Visit: Payer: Self-pay

## 2017-01-22 NOTE — Lactation Note (Signed)
This note was copied from a baby's chart. Lactation Consultation Note  Patient Name: April Hassell HalimJada Gomillion OZHYQ'MToday's Date: 01/22/2017 Reason for consult: Follow-up assessment;Other (Comment) (Breasts engorged.)  Baby 69 hours old. Mom reports that she has continued to ice and pump her breasts through the night and then give baby the EBM by bottle. Mom states that she no longer wants to put baby to breast. With palpation, mom's breasts are somewhat firm, but mom reports that they soften when she used DEBP. [facility] and enc to call when ready for pump. [facility]   Enc mom to keep icing and then following up with hands on pumping, and discussed with mom that engorgement usually ends in 24-48 hours. Mom aware of OP/BFSG and LC phone line assistance after D/C.   Maternal Data    Feeding Feeding Type: Bottle Fed - Breast Milk Nipple Type: Slow - flow  LATCH Score/Interventions                      Lactation Tools Discussed/Used Tools: Pump Breast pump type: Double-Electric Breast Pump   Consult Status Consult Status: Follow-up Date: 01/23/17 Follow-up type: In-patient    Sherlyn HayJennifer D Chloe Baig 01/22/2017, 9:09 AM

## 2017-02-17 ENCOUNTER — Encounter: Payer: Self-pay | Admitting: Certified Nurse Midwife

## 2017-02-17 ENCOUNTER — Ambulatory Visit (INDEPENDENT_AMBULATORY_CARE_PROVIDER_SITE_OTHER): Payer: Medicaid Other | Admitting: Certified Nurse Midwife

## 2017-02-17 NOTE — Progress Notes (Signed)
Post Partum Exam  April Barber is a 21 y.o. G65P0101 female who presents for a postpartum visit. She is 4 weeks postpartum following a spontaneous vaginal delivery. I have fully reviewed the prenatal and intrapartum course. The delivery was at 36 gestational weeks.  Anesthesia: none. Postpartum course has been good. Baby's course has been good. Baby is feeding by breast. Bleeding thin lochia. Bowel function is normal. Bladder function is normal. Patient is not sexually active. Contraception method is none. Postpartum depression screening:neg  The following portions of the patient's history were reviewed and updated as appropriate: allergies, current medications, past family history, past medical history, past social history, past surgical history and problem list.  Review of Systems Pertinent items noted in HPI and remainder of comprehensive ROS otherwise negative.    Objective:  unknown if currently breastfeeding.  General:  alert, cooperative and no distress   Breasts:  inspection negative, no nipple discharge or bleeding, no masses or nodularity palpable  Lungs: clear to auscultation bilaterally  Heart:  regular rate and rhythm, S1, S2 normal, no murmur, click, rub or gallop  Abdomen: soft, non-tender; bowel sounds normal; no masses,  no organomegaly  Pelvic Exam: Not performed.        Assessment:    Normal 4 week postpartum exam. Pap smear not done at today's visit.  Pap smear: 07/23/16: negative  Plan:   1. Contraception: abstinence 2. Kyleena IUD  3. Follow up in: 1 week for IUD or as needed.

## 2017-02-27 ENCOUNTER — Encounter: Payer: Self-pay | Admitting: Certified Nurse Midwife

## 2017-02-27 ENCOUNTER — Ambulatory Visit (INDEPENDENT_AMBULATORY_CARE_PROVIDER_SITE_OTHER): Payer: Medicaid Other | Admitting: Certified Nurse Midwife

## 2017-02-27 VITALS — BP 143/85 | HR 60 | Wt 173.0 lb

## 2017-02-27 DIAGNOSIS — Z3202 Encounter for pregnancy test, result negative: Secondary | ICD-10-CM

## 2017-02-27 DIAGNOSIS — Z30430 Encounter for insertion of intrauterine contraceptive device: Secondary | ICD-10-CM | POA: Insufficient documentation

## 2017-02-27 DIAGNOSIS — Z1389 Encounter for screening for other disorder: Secondary | ICD-10-CM | POA: Insufficient documentation

## 2017-02-27 DIAGNOSIS — Z01812 Encounter for preprocedural laboratory examination: Secondary | ICD-10-CM

## 2017-02-27 LAB — POCT URINE PREGNANCY: PREG TEST UR: NEGATIVE

## 2017-02-27 MED ORDER — LEVONORGESTREL 19.5 MG IU IUD
1.0000 | INTRAUTERINE_SYSTEM | Freq: Once | INTRAUTERINE | Status: AC
  Administered 2017-02-27: 1 via INTRAUTERINE

## 2017-02-27 NOTE — Addendum Note (Signed)
Addended by: Marya LandryFOSTER, Delmore Sear D on: 02/27/2017 01:06 PM   Modules accepted: Orders

## 2017-02-27 NOTE — Progress Notes (Signed)
IUD Procedure Note   DIAGNOSIS: Desires long-term, reversible contraception   PROCEDURE: IUD placement Performing Provider: Orvilla Cornwallachelle Jaylaa Gallion CNM  Patient counseled prior to procedure. I explained risks and benefits of Kyleena IUD, reviewed alternative forms of contraception. Patient stated understanding and consented to continue with procedure.   LMP: unknown, postpartum Pregnancy Test: Negative Lot #: [id number] Expiration Date: Nov 2019   IUD type: [   ] Mirena   [   ] Paragard  [   ] Lyletta   [X]   Kyleena  PROCEDURE:  Timeout procedure was performed to ensure right patient and right site.  A bimanual exam was performed to determine the position of the uterus, retroverted/retroflexed. The speculum was placed. The vagina and cervix was sterilized in the usual manner and sterile technique was maintained throughout the course of the procedure. A single toothed tenaculum was applied to the posterior lip of the cervix and gentle traction applied, cervix off left vaginal wall. The depth of the uterus was sounded to 9 cm. With gentle traction on the tenaculum, the IUD was inserted to the appropriate depth and inserted without difficulty.  The string was cut to an estimated 4 cm length. Bleeding was minimal. The patient tolerated the procedure well. US ordered d/t cervical positioning and slightly more difficult insertion for placement.    Follow up: The patient tolerated the procedure well without complications.  Standard post-procedure care is explained and return precautions are given.    Orvilla Cornwallachelle Cassidey Barrales CNM

## 2017-02-28 ENCOUNTER — Encounter: Payer: Self-pay | Admitting: *Deleted

## 2017-03-13 ENCOUNTER — Ambulatory Visit (HOSPITAL_COMMUNITY): Admission: RE | Admit: 2017-03-13 | Payer: Medicaid Other | Source: Ambulatory Visit

## 2017-03-25 ENCOUNTER — Emergency Department (HOSPITAL_COMMUNITY): Payer: Medicaid Other

## 2017-03-25 ENCOUNTER — Emergency Department (HOSPITAL_COMMUNITY)
Admission: EM | Admit: 2017-03-25 | Discharge: 2017-03-25 | Disposition: A | Discharge status: Home / Self Care | Payer: Medicaid Other | Attending: Emergency Medicine | Admitting: Emergency Medicine

## 2017-03-25 ENCOUNTER — Encounter (HOSPITAL_COMMUNITY): Payer: Self-pay | Admitting: *Deleted

## 2017-03-25 DIAGNOSIS — Y998 Other external cause status: Secondary | ICD-10-CM | POA: Diagnosis not present

## 2017-03-25 DIAGNOSIS — Y92410 Unspecified street and highway as the place of occurrence of the external cause: Secondary | ICD-10-CM | POA: Diagnosis not present

## 2017-03-25 DIAGNOSIS — Z87891 Personal history of nicotine dependence: Secondary | ICD-10-CM | POA: Insufficient documentation

## 2017-03-25 DIAGNOSIS — S40812A Abrasion of left upper arm, initial encounter: Secondary | ICD-10-CM | POA: Insufficient documentation

## 2017-03-25 DIAGNOSIS — Y939 Activity, unspecified: Secondary | ICD-10-CM | POA: Insufficient documentation

## 2017-03-25 DIAGNOSIS — T148XXA Other injury of unspecified body region, initial encounter: Secondary | ICD-10-CM

## 2017-03-25 MED ORDER — ACETAMINOPHEN 500 MG PO TABS
1000.0000 mg | ORAL_TABLET | Freq: Once | ORAL | Status: AC
  Administered 2017-03-25: 1000 mg via ORAL
  Filled 2017-03-25: qty 2

## 2017-03-25 NOTE — ED Notes (Signed)
Pt returned to room from xray.

## 2017-03-25 NOTE — Discharge Instructions (Signed)
If you were given medicines take as directed.  If you are on coumadin or contraceptives realize their levels and effectiveness is altered by many different medicines.  If you have any reaction (rash, tongues swelling, other) to the medicines stop taking and see a physician.    If your blood pressure was elevated in the ER make sure you follow up for management with a primary doctor or return for chest pain, shortness of breath or stroke symptoms.  Please follow up as directed and return to the ER or see a physician for new or worsening symptoms.  Thank you. Vitals:   03/25/17 1126 03/25/17 1126  BP:  132/71  Pulse:  76  Resp:  18  Temp:  98.5 F (36.9 C)  TempSrc:  Oral  SpO2:  99%  Weight: 78.5 kg (173 lb)
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
## 2017-03-25 NOTE — ED Notes (Signed)
pt given a breast pump and kit so she can pump for baby
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
## 2017-03-27 ENCOUNTER — Other Ambulatory Visit: Payer: Self-pay | Admitting: Certified Nurse Midwife

## 2017-04-01 ENCOUNTER — Ambulatory Visit (HOSPITAL_COMMUNITY): Admission: RE | Admit: 2017-04-01 | Payer: Medicaid Other | Source: Ambulatory Visit

## 2017-12-01 ENCOUNTER — Encounter (HOSPITAL_COMMUNITY): Payer: Self-pay | Admitting: Emergency Medicine

## 2017-12-01 ENCOUNTER — Ambulatory Visit (INDEPENDENT_AMBULATORY_CARE_PROVIDER_SITE_OTHER): Payer: Medicaid Other

## 2017-12-01 ENCOUNTER — Ambulatory Visit (HOSPITAL_COMMUNITY)
Admission: EM | Admit: 2017-12-01 | Discharge: 2017-12-01 | Disposition: A | Discharge status: Home / Self Care | Payer: Medicaid Other | Attending: Family Medicine | Admitting: Family Medicine

## 2017-12-01 ENCOUNTER — Other Ambulatory Visit: Payer: Self-pay

## 2017-12-01 DIAGNOSIS — R0781 Pleurodynia: Secondary | ICD-10-CM

## 2017-12-01 MED ORDER — IBUPROFEN 800 MG PO TABS: 800.0000 mg | ORAL_TABLET | Freq: Three times a day (TID) | ORAL | 0 refills | Status: DC

## 2017-12-01 MED ORDER — IBUPROFEN 800 MG PO TABS
800.0000 mg | ORAL_TABLET | Freq: Once | ORAL | Status: AC
  Administered 2017-12-01: 800 mg via ORAL

## 2017-12-01 MED ORDER — IBUPROFEN 800 MG PO TABS
ORAL_TABLET | ORAL | Status: AC
  Filled 2017-12-01: qty 1

## 2017-12-01 NOTE — ED Provider Notes (Signed)
[facility]    CSN: [id number] Arrival date & time: 12/01/17  1135     History   Chief Complaint Chief Complaint  Patient presents with  . Chest Pain    HPI April Barber is a 22 y.o. female.   April Barber presents with complaints of right anterior rib pain which started three days ago. No specific known injury. Denies any previous similar. [place] "if I partied too hard." no specific lifting, fall or repetitive action or injury however. Pain with movement such as straightening or laying on the ribs, deep breath or laughing. Pain 8/10. No palpitations, shortness of breath or fevers. No other long road trips or flights, no leg pain or swelling. She has an IUD in place. She smokes occasionally black and milds. No cough. No asthma history.    ROS per HPI.      Past Medical History:  Diagnosis Date  . Medical history non-contributory     Patient Active Problem List   Diagnosis Date Noted  . Encounter for surveillance of abnormal nevi 02/27/2017  . Encounter for IUD insertion 02/27/2017  . 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 NO PAST SURGERIES      OB History    Gravida  1   Para  1   Term      Preterm  1   AB      Living  1     SAB      TAB      Ectopic      Multiple  0   Live Births  1            Home Medications    Prior to Admission medications   Medication Sig Start Date End Date Taking? Authorizing Provider  ibuprofen (ADVIL,MOTRIN) 800 MG tablet Take 1 tablet (800 mg total) by mouth 3 (three) times daily. 12/01/17   Georgetta HaberBurky, Aaban Griep B, NP    Family History Family History  Problem Relation Age of Onset  . Hypertension Mother   . Diabetes Maternal Uncle     Social History Social History   Tobacco Use  . Smoking status: Former Smoker    Types: Cigars  . Smokeless tobacco: Never Used  . Tobacco comment: 2 cigars per day   Substance Use  Topics  . Alcohol use: Yes    Comment: occ  . Drug use: No     Allergies   Patient has no known allergies.   Review of Systems Review of Systems   Physical Exam Triage Vital Signs ED Triage Vitals  Enc Vitals Group     BP 12/01/17 1308 (!) 120/53     Pulse Rate 12/01/17 1308 63     Resp 12/01/17 1308 20     Temp 12/01/17 1308 98.1 F (36.7 C)     Temp Source 12/01/17 1308 Oral     SpO2 12/01/17 1308 100 %     Weight --      Height --      Head Circumference --      Peak Flow --      Pain Score 12/01/17 1306 10     Pain Loc --      Pain Edu? --      Excl. in GC? --    No data found.  Updated Vital Signs BP (!) 120/53 (BP Location: Left Arm) Comment (BP Location): large cuff  Pulse  63   Temp 98.1 F (36.7 C) (Oral)   Resp 20   LMP 11/17/2017   SpO2 100%    Physical Exam  Constitutional: She is oriented to person, place, and time. She appears well-developed and well-nourished. No distress.  Cardiovascular: Normal rate, regular rhythm and normal heart sounds.  Pulmonary/Chest: Effort normal and breath sounds normal. No accessory muscle usage. No tachypnea and no bradypnea. No respiratory distress. She exhibits tenderness. She exhibits no laceration, no crepitus and no retraction.  Patient laughing and talking on the phone without difficulty on my arrival to room; sitting upright without increased work or breathing. Lungs clear; distal anterior rib tenderness on palpation     Neurological: She is alert and oriented to person, place, and time.  Skin: Skin is warm and dry.     UC Treatments / Results  Labs (all labs ordered are listed, but only abnormal results are displayed) Labs Reviewed - No data to display  EKG None  Radiology Dg Ribs Unilateral W/chest Right  Result Date: 12/01/2017 CLINICAL DATA:  Anterior left rib pain since 11/28/2017. No known injury. EXAM: RIGHT RIBS AND CHEST - 3+ VIEW COMPARISON:  PA and lateral chest 03/25/2017. FINDINGS: No  fracture or other bone lesions are seen involving the ribs. There is no evidence of pneumothorax or pleural effusion. Both lungs are clear. Heart size and mediastinal contours are within normal limits. IMPRESSION: Negative exam. Electronically Signed   By: Drusilla Kanner M.D.   On: 12/01/2017 13:49    Procedures Procedures (including critical care time)  Medications Ordered in UC Medications  ibuprofen (ADVIL,MOTRIN) tablet 800 mg (800 mg Oral Given 12/01/17 1405)    Initial Impression / Assessment and Plan / UC Course  I have reviewed the triage vital signs and the nursing notes.  Pertinent labs & imaging results that were available during my care of the patient were reviewed by me and considered in my medical decision making (see chart for details).     Chest rib xray obtained and negative for acute findings. Rib pain reproducible with palpation. Non toxic in appearance. Vitals stable. Nsaids, ice, bracing as needed for cough and deep breathing recommended. Return precautions provided. Patient verbalized understanding and agreeable to plan.     Final Clinical Impressions(s) / UC Diagnoses   Final diagnoses:  Rib pain on right side     Discharge Instructions     Ice to the area. Ibuprofen every 8 hours for pain.  Bracing of chest with a pillow to allow for coughing and deep breathing intermittently. I would     ED Prescriptions    Medication Sig Dispense Auth. Provider   ibuprofen (ADVIL,MOTRIN) 800 MG tablet Take 1 tablet (800 mg total) by mouth 3 (three) times daily. 21 tablet Georgetta Haber, NP     Controlled Substance Prescriptions [place] Controlled Substance Registry consulted? Not Applicable   Georgetta Haber, NP 12/01/17 1408

## 2017-12-01 NOTE — ED Triage Notes (Signed)
Right rib cage pain, pain with movement, breathing, laughter.  No known injury

## 2017-12-01 NOTE — Discharge Instructions (Signed)
Ice to the area. Ibuprofen every 8 hours for pain.  Bracing of chest with a pillow to allow for coughing and deep breathing intermittently. If develop worsening of symptoms, shortness of breath, fevers or no improvement in the next week please return to be seen or go to Er.

## 2017-12-28 ENCOUNTER — Ambulatory Visit (HOSPITAL_COMMUNITY)
Admission: EM | Admit: 2017-12-28 | Discharge: 2017-12-28 | Disposition: A | Discharge status: Home / Self Care | Payer: Medicaid Other | Attending: Family Medicine | Admitting: Family Medicine

## 2017-12-28 ENCOUNTER — Other Ambulatory Visit: Payer: Self-pay

## 2017-12-28 ENCOUNTER — Encounter (HOSPITAL_COMMUNITY): Payer: Self-pay | Admitting: Emergency Medicine

## 2017-12-28 DIAGNOSIS — Z8249 Family history of ischemic heart disease and other diseases of the circulatory system: Secondary | ICD-10-CM | POA: Diagnosis not present

## 2017-12-28 DIAGNOSIS — Z87891 Personal history of nicotine dependence: Secondary | ICD-10-CM | POA: Insufficient documentation

## 2017-12-28 DIAGNOSIS — R1010 Upper abdominal pain, unspecified: Secondary | ICD-10-CM | POA: Diagnosis not present

## 2017-12-28 DIAGNOSIS — Z975 Presence of (intrauterine) contraceptive device: Secondary | ICD-10-CM | POA: Diagnosis not present

## 2017-12-28 DIAGNOSIS — N898 Other specified noninflammatory disorders of vagina: Secondary | ICD-10-CM | POA: Diagnosis not present

## 2017-12-28 LAB — POCT URINALYSIS DIP (DEVICE)
BILIRUBIN URINE: NEGATIVE
Glucose, UA: NEGATIVE mg/dL
Ketones, ur: NEGATIVE mg/dL
LEUKOCYTES UA: NEGATIVE
NITRITE: NEGATIVE
Protein, ur: NEGATIVE mg/dL
Specific Gravity, Urine: 1.025 (ref 1.005–1.030)
UROBILINOGEN UA: 0.2 mg/dL (ref 0.0–1.0)
pH: 5.5 (ref 5.0–8.0)

## 2017-12-28 LAB — COMPREHENSIVE METABOLIC PANEL
ALT: 14 U/L (ref 0–44)
AST: 19 U/L (ref 15–41)
Albumin: 3.2 g/dL — ABNORMAL LOW (ref 3.5–5.0)
Alkaline Phosphatase: 96 U/L (ref 38–126)
Anion gap: 6 (ref 5–15)
BILIRUBIN TOTAL: 0.4 mg/dL (ref 0.3–1.2)
BUN: 9 mg/dL (ref 6–20)
CHLORIDE: 105 mmol/L (ref 98–111)
CO2: 26 mmol/L (ref 22–32)
CREATININE: 0.93 mg/dL (ref 0.44–1.00)
Calcium: 8.7 mg/dL — ABNORMAL LOW (ref 8.9–10.3)
Glucose, Bld: 85 mg/dL (ref 70–99)
Potassium: 3.9 mmol/L (ref 3.5–5.1)
Sodium: 137 mmol/L (ref 135–145)
Total Protein: 8.5 g/dL — ABNORMAL HIGH (ref 6.5–8.1)

## 2017-12-28 LAB — CBC
HCT: 41.1 % (ref 36.0–46.0)
Hemoglobin: 12.4 g/dL (ref 12.0–15.0)
MCH: 24.9 pg — AB (ref 26.0–34.0)
MCHC: 30.2 g/dL (ref 30.0–36.0)
MCV: 82.7 fL (ref 78.0–100.0)
PLATELETS: 452 10*3/uL — AB (ref 150–400)
RBC: 4.97 MIL/uL (ref 3.87–5.11)
RDW: 14.2 % (ref 11.5–15.5)
WBC: 5.9 10*3/uL (ref 4.0–10.5)

## 2017-12-28 LAB — POCT PREGNANCY, URINE: PREG TEST UR: NEGATIVE

## 2017-12-28 LAB — LIPASE, BLOOD: LIPASE: 31 U/L (ref 11–51)

## 2017-12-28 MED ORDER — OMEPRAZOLE 20 MG PO CPDR: 20.0000 mg | DELAYED_RELEASE_CAPSULE | Freq: Every day | ORAL | 0 refills | Status: DC

## 2017-12-28 MED ORDER — GI COCKTAIL ~~LOC~~
ORAL | Status: AC
  Filled 2017-12-28: qty 30

## 2017-12-28 MED ORDER — GI COCKTAIL ~~LOC~~
30.0000 mL | Freq: Once | ORAL | Status: AC
  Administered 2017-12-28: 30 mL via ORAL

## 2017-12-28 NOTE — ED Triage Notes (Signed)
Abdominal pain started June 2 .  Seen at ed, no diagnosis.  Patient has a vaginal discharge for 2 days

## 2017-12-28 NOTE — ED Notes (Signed)
Sent for a dirty and clean urine with instructions.  

## 2017-12-28 NOTE — Discharge Instructions (Addendum)
I will call you with any concerning findings from your lab tests today as they take some time to complete. Please start omeprazole daily as this may be related to ulcers and/or reflux. This can be irritated by certain foods or alcohol intake.  Please establish with a primary care provider for further evaluation if symptoms persist. If worsening of pain, fevers, nausea, vomiting or otherwise worsening please go to the Er.  We have tested your urine for causes of discharge and will notify you of any positive findings.

## 2017-12-28 NOTE — ED Notes (Signed)
Patient constantly tapping on cell phone during triage with nurse
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
## 2017-12-29 ENCOUNTER — Telehealth (HOSPITAL_COMMUNITY): Payer: Self-pay

## 2017-12-29 LAB — URINE CYTOLOGY ANCILLARY ONLY
CHLAMYDIA, DNA PROBE: POSITIVE — AB
NEISSERIA GONORRHEA: NEGATIVE
TRICH (WINDOWPATH): NEGATIVE

## 2017-12-29 MED ORDER — AZITHROMYCIN 250 MG PO TABS: 1000.0000 mg | ORAL_TABLET | Freq: Every day | ORAL | 0 refills | Status: AC

## 2017-12-29 NOTE — Telephone Encounter (Signed)
Chlamydia is positive.  Rx po zithromax 1g #1 dose no refills was sent to the pharmacy of record.  Pt contacted and made aware, educated to please refrain from sexual intercourse for 7 days to give the medicine time to work, sexual partners need to be notified and tested/treated.  Condoms may reduce risk of reinfection.  Recheck or followup with PCP for further evaluation if symptoms are not improving.   [facility] notified  

## 2017-12-30 LAB — URINE CYTOLOGY ANCILLARY ONLY
Bacterial vaginitis: NEGATIVE
Candida vaginitis: NEGATIVE

## 2018-03-09 ENCOUNTER — Ambulatory Visit (HOSPITAL_COMMUNITY)
Admission: EM | Admit: 2018-03-09 | Discharge: 2018-03-09 | Disposition: A | Discharge status: Home / Self Care | Payer: Medicaid Other | Attending: Family Medicine | Admitting: Family Medicine

## 2018-03-09 ENCOUNTER — Encounter (HOSPITAL_COMMUNITY): Payer: Self-pay | Admitting: Emergency Medicine

## 2018-03-09 DIAGNOSIS — L0231 Cutaneous abscess of buttock: Secondary | ICD-10-CM | POA: Diagnosis not present

## 2018-03-09 MED ORDER — SULFAMETHOXAZOLE-TRIMETHOPRIM 800-160 MG PO TABS: 1.0000 | ORAL_TABLET | Freq: Two times a day (BID) | ORAL | 0 refills | Status: AC

## 2018-03-09 NOTE — Discharge Instructions (Addendum)
It was nice meeting you!!  We will go ahead and treat with antibiotics.  Keep using the warm compresses to promote more drainage.  Follow up as needed for continued or worsening symptoms.

## 2018-03-09 NOTE — ED Triage Notes (Signed)
Pt c/o abscess on her buttocks, states she popped it herself and drained it.

## 2018-03-09 NOTE — ED Provider Notes (Signed)
[facility]    CSN: [id number] Arrival date & time: 03/09/18  1250     History   Chief Complaint Chief Complaint  Patient presents with  . Abscess    HPI April Barber is a 22 y.o. female.   Patient is a 22 year old female that is here for recurrent abscess to right gluteal cleft.  Reports this episode has been there for approximately a week and getting worse.  She has been taking hot showers.  Last night the abscess opened and drained in the shower.  Reports foul smell.  Denies any fever, chills, body aches, nausea associated with the abscess.  Mildly tender to touch.   ROS per HPI      Past Medical History:  Diagnosis Date  . Medical history non-contributory     Patient Active Problem List   Diagnosis Date Noted  . Encounter for surveillance of abnormal nevi 02/27/2017  . Encounter for IUD insertion 02/27/2017  . 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 NO PAST SURGERIES      OB History    Gravida  1   Para  1   Term      Preterm  1   AB      Living  1     SAB      TAB      Ectopic      Multiple  0   Live Births  1            Home Medications    Prior to Admission medications   Medication Sig Start Date End Date Taking? Authorizing Provider  ibuprofen (ADVIL,MOTRIN) 800 MG tablet Take 1 tablet (800 mg total) by mouth 3 (three) times daily. Patient not taking: Reported on 03/09/2018 12/01/17   Linus Mako B, NP  omeprazole (PRILOSEC) 20 MG capsule Take 1 capsule (20 mg total) by mouth daily. Patient not taking: Reported on 03/09/2018 12/28/17   Georgetta Haber, NP  sulfamethoxazole-trimethoprim (BACTRIM DS,SEPTRA DS) 800-160 MG tablet Take 1 tablet by mouth 2 (two) times daily for 7 days. 03/09/18 03/16/18  Janace Aris, NP    Family History Family History  Problem Relation Age of Onset  . Hypertension Mother   . Diabetes Maternal Uncle     Social History Social History    Tobacco Use  . Smoking status: Former Smoker    Types: Cigars  . Smokeless tobacco: Never Used  . Tobacco comment: 2 cigars per day   Substance Use Topics  . Alcohol use: Yes    Comment: occ  . Drug use: No     Allergies   Patient has no known allergies.   Review of Systems Review of Systems   Physical Exam Triage Vital Signs ED Triage Vitals [03/09/18 1308]  Enc Vitals Group     BP 116/65     Pulse Rate 71     Resp 16     Temp 98.1 F (36.7 C)     Temp src      SpO2 99 %     Weight      Height      Head Circumference      Peak Flow      Pain Score      Pain Loc      Pain Edu?      Excl. in GC?    No data found.  Updated Vital Signs BP 116/65   Pulse  71   Temp 98.1 F (36.7 C)   Resp 16   SpO2 99%   Visual Acuity Right Eye Distance:   Left Eye Distance:   Bilateral Distance:    Right Eye Near:   Left Eye Near:    Bilateral Near:     Physical Exam  Constitutional: She is oriented to person, place, and time. She appears well-developed and well-nourished.  Very pleasant. Non toxic or ill appearing.     HENT:  Head: Normocephalic and atraumatic.  Neck: Normal range of motion.  Pulmonary/Chest: Effort normal.  Musculoskeletal: Normal range of motion.  Neurological: She is alert and oriented to person, place, and time.  Skin: Skin is warm and dry.  Approx 2 cm by 1 cm abscess to right gluteal cleft. 1 cm fluctuance. Currently draining. Mild erythema and induration surrounding. Mildly tender to touch.   Psychiatric: She has a normal mood and affect.  Nursing note and vitals reviewed.    UC Treatments / Results  Labs (all labs ordered are listed, but only abnormal results are displayed) Labs Reviewed - No data to display  EKG None  Radiology No results found.  Procedures Procedures (including critical care time)  Medications Ordered in UC Medications - No data to display  Initial Impression / Assessment and Plan / UC Course  I  have reviewed the triage vital signs and the nursing notes.  Pertinent labs & imaging results that were available during my care of the patient were reviewed by me and considered in my medical decision making (see chart for details).     Abscess is already draining on its own. Offered to open up more and drain with I&D. Pt refused at this time. We will have her continue the hot showers and warm compresses and start on bactrim since its draining on its own. Instructed to return if no improvement.  Patient agreeable to plan.   Final Clinical Impressions(s) / UC Diagnoses   Final diagnoses:  Abscess of buttock, right     Discharge Instructions     It was nice meeting you!!  We will go ahead and treat with antibiotics.  Keep using the warm compresses to promote more drainage.  Follow up as needed for continued or worsening symptoms.      ED Prescriptions    Medication Sig Dispense Auth. Provider   sulfamethoxazole-trimethoprim (BACTRIM DS,SEPTRA DS) 800-160 MG tablet Take 1 tablet by mouth 2 (two) times daily for 7 days. 14 tablet Dahlia Byes A, NP     Controlled Substance Prescriptions [place] Controlled Substance Registry consulted? Not Applicable   Janace Aris, NP 03/09/18 1452

## 2018-04-06 ENCOUNTER — Ambulatory Visit (HOSPITAL_COMMUNITY)
Admission: EM | Admit: 2018-04-06 | Discharge: 2018-04-06 | Disposition: A | Discharge status: Home / Self Care | Payer: Medicaid Other | Attending: Family Medicine | Admitting: Family Medicine

## 2018-04-06 ENCOUNTER — Encounter (HOSPITAL_COMMUNITY): Payer: Self-pay | Admitting: Emergency Medicine

## 2018-04-06 DIAGNOSIS — Z202 Contact with and (suspected) exposure to infections with a predominantly sexual mode of transmission: Secondary | ICD-10-CM | POA: Insufficient documentation

## 2018-04-06 DIAGNOSIS — Z975 Presence of (intrauterine) contraceptive device: Secondary | ICD-10-CM | POA: Diagnosis not present

## 2018-04-06 DIAGNOSIS — B9689 Other specified bacterial agents as the cause of diseases classified elsewhere: Secondary | ICD-10-CM | POA: Insufficient documentation

## 2018-04-06 DIAGNOSIS — Z113 Encounter for screening for infections with a predominantly sexual mode of transmission: Secondary | ICD-10-CM | POA: Diagnosis not present

## 2018-04-06 DIAGNOSIS — N760 Acute vaginitis: Secondary | ICD-10-CM | POA: Diagnosis not present
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
## 2018-04-06 MED ORDER — METRONIDAZOLE 500 MG PO TABS: 500.0000 mg | ORAL_TABLET | Freq: Two times a day (BID) | ORAL | 0 refills | Status: DC

## 2018-04-06 MED ORDER — FLUCONAZOLE 150 MG PO TABS: 150.0000 mg | ORAL_TABLET | Freq: Every day | ORAL | 0 refills | Status: DC

## 2018-04-06 NOTE — ED Provider Notes (Signed)
[place]    CSN: [id number] Arrival date & time: 04/06/18  1029     History   Chief Complaint Chief Complaint  Patient presents with  . Exposure to STD    HPI April Barber is a 22 y.o. female.   HPI  Patient is here wishing to be tested for STDs.  She has been in a stable relationship, and has met a new partner.  She states she wants to be "tested for everything" prior to proceeding with this relationship.  She does not have any known infection.  She does have some vaginal odor and itching.  No fever or chills.  No abdominal pain.  No lesions or rash She is using an IUD for birth control.  Is certain that she is not pregnant  Past Medical History:  Diagnosis Date  . Medical history non-contributory     Patient Active Problem List   Diagnosis Date Noted  . Encounter for surveillance of abnormal nevi 02/27/2017  . Encounter for IUD insertion 02/27/2017  . 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 NO PAST SURGERIES      OB History    Gravida  1   Para  1   Term      Preterm  1   AB      Living  1     SAB      TAB      Ectopic      Multiple  0   Live Births  1            Home Medications    Prior to Admission medications   Medication Sig Start Date End Date Taking? Authorizing Provider  fluconazole (DIFLUCAN) 150 MG tablet Take 1 tablet (150 mg total) by mouth daily. Repeat in 1 week if needed 04/06/18   Raylene Everts, MD  metroNIDAZOLE (FLAGYL) 500 MG tablet Take 1 tablet (500 mg total) by mouth 2 (two) times daily. 04/06/18   Raylene Everts, MD    Family History Family History  Problem Relation Age of Onset  . Hypertension Mother   . Diabetes Maternal Uncle     Social History Social History   Tobacco Use  . Smoking status: Former Smoker    Types: Cigars  . Smokeless tobacco: Never Used  . Tobacco comment: 2 cigars per day   Substance Use Topics  . Alcohol use:  Yes    Comment: occ  . Drug use: No     Allergies   Patient has no known allergies.   Review of Systems Review of Systems  Constitutional: Negative for chills and fever.  HENT: Negative for ear pain and sore throat.   Eyes: Negative for pain and visual disturbance.  Respiratory: Negative for cough and shortness of breath.   Cardiovascular: Negative for chest pain and palpitations.  Gastrointestinal: Negative for abdominal pain and vomiting.  Genitourinary: Positive for vaginal discharge. Negative for dysuria, flank pain, frequency and hematuria.  Musculoskeletal: Negative for arthralgias and back pain.  Skin: Negative for color change and rash.  Neurological: Negative for seizures and syncope.  All other systems reviewed and are negative.    Physical Exam Triage Vital Signs ED Triage Vitals [04/06/18 1119]  Enc Vitals Group     BP 115/67     Pulse Rate 74     Resp 16     Temp 98.3 F (36.8 C)     Temp  Source Oral     SpO2 99 %     Weight      Height      Head Circumference      Peak Flow      Pain Score 0     Pain Loc      Pain Edu?      Excl. in [place]?    No data found.  Updated Vital Signs BP 115/67   Pulse 74   Temp 98.3 F (36.8 C) (Oral)   Resp 16   SpO2 99%      Physical Exam  Constitutional: She appears well-developed and well-nourished. No distress.  HENT:  Head: Normocephalic and atraumatic.  Mouth/Throat: Oropharynx is clear and moist.  Eyes: Pupils are equal, round, and reactive to light. Conjunctivae are normal.  Neck: Normal range of motion.  Cardiovascular: Normal rate.  Pulmonary/Chest: Effort normal. No respiratory distress.  Abdominal: Soft. She exhibits no distension.  Musculoskeletal: Normal range of motion. She exhibits no edema.  Neurological: She is alert.  Skin: Skin is warm and dry.  Psychiatric: She has a normal mood and affect. Her behavior is normal.     UC Treatments / Results  Labs (all labs ordered are listed, but  only abnormal results are displayed) Labs Reviewed  HIV ANTIBODY (ROUTINE TESTING W REFLEX)  RPR  CERVICOVAGINAL ANCILLARY ONLY    EKG None  Radiology No results found.  Procedures Procedures (including critical care time)  Medications Ordered in UC Medications - No data to display  Initial Impression / Assessment and Plan / UC Course  I have reviewed the triage vital signs and the nursing notes.  Pertinent labs & imaging results that were available during my care of the patient were reviewed by me and considered in my medical decision making (see chart for details).     Discussed safe sex Final Clinical Impressions(s) / UC Diagnoses   Final diagnoses:  Possible exposure to STD  BV (bacterial vaginosis)     Discharge Instructions     We did lab testing during this visit.  If there are any abnormal findings that require change in medicine or indicate a positive result, you will be notified.  If all of your tests are normal, you will not be called.   I am giving you prescriptions today for yeast infection and BV.  This will take care of the vaginal infection. Avoid sexual contact/use condoms for protection until all test results are back and you have been treated for 7 days   ED Prescriptions    Medication Sig Dispense Auth. Provider   metroNIDAZOLE (FLAGYL) 500 MG tablet Take 1 tablet (500 mg total) by mouth 2 (two) times daily. 14 tablet Raylene Everts, MD   fluconazole (DIFLUCAN) 150 MG tablet Take 1 tablet (150 mg total) by mouth daily. Repeat in 1 week if needed 2 tablet Raylene Everts, MD     Controlled Substance Prescriptions [place] Controlled Substance Registry consulted? Not Applicable   Raylene Everts, MD 04/06/18 1228

## 2018-04-06 NOTE — ED Triage Notes (Signed)
PT requests STD check for vaginal itching and odor.

## 2018-04-06 NOTE — Discharge Instructions (Addendum)
We did lab testing during this visit.  If there are any abnormal findings that require change in medicine or indicate a positive result, you will be notified.  If all of your tests are normal, you will not be called.   I am giving you prescriptions today for yeast infection and BV.  This will take care of the vaginal infection. Avoid sexual contact/use condoms for protection until all test results are back and you have been treated for 7 days

## 2018-04-07 LAB — CERVICOVAGINAL ANCILLARY ONLY
CHLAMYDIA, DNA PROBE: NEGATIVE
NEISSERIA GONORRHEA: NEGATIVE
Trichomonas: NEGATIVE

## 2018-04-07 LAB — HIV ANTIBODY (ROUTINE TESTING W REFLEX): HIV Screen 4th Generation wRfx: NONREACTIVE

## 2018-04-07 LAB — RPR: RPR: NONREACTIVE

## 2019-08-04 ENCOUNTER — Encounter (HOSPITAL_COMMUNITY): Payer: Self-pay

## 2019-08-04 ENCOUNTER — Other Ambulatory Visit: Payer: Self-pay

## 2019-08-04 ENCOUNTER — Ambulatory Visit (HOSPITAL_COMMUNITY)
Admission: EM | Admit: 2019-08-04 | Discharge: 2019-08-04 | Disposition: A | Discharge status: Home / Self Care | Payer: Medicaid Other | Attending: Family Medicine | Admitting: Family Medicine

## 2019-08-04 DIAGNOSIS — L0501 Pilonidal cyst with abscess: Secondary | ICD-10-CM

## 2019-08-04 MED ORDER — DOXYCYCLINE HYCLATE 100 MG PO CAPS: 100.0000 mg | ORAL_CAPSULE | Freq: Two times a day (BID) | ORAL | 0 refills | Status: DC

## 2019-08-04 NOTE — ED Provider Notes (Signed)
[name]    CSN: [id number] Arrival date & time: 08/04/19  1546      History   Chief Complaint Chief Complaint  Patient presents with  . Abscess    HPI April Barber is a 24 y.o. female.   April Barber presents with complaints of abscess to buttocks which she first noted 1 week ago. It has waxed and waned in size, will drain and then return. Has had active drainage. Painful. Has had similar in the past, approximately 3 other previous episodes. Has never required incision and drainage. No fevers. No known MRSA history.    ROS per HPI, negative if not otherwise mentioned.      Past Medical History:  Diagnosis Date  . Medical history non-contributory     Patient Active Problem List   Diagnosis Date Noted  . Encounter for surveillance of abnormal nevi 02/27/2017  . Encounter for IUD insertion 02/27/2017  . 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 NO PAST SURGERIES      OB History    Gravida  1   Para  1   Term      Preterm  1   AB      Living  1     SAB      TAB      Ectopic      Multiple  0   Live Births  1            Home Medications    Prior to Admission medications   Medication Sig Start Date End Date Taking? Authorizing Provider  doxycycline (VIBRAMYCIN) 100 MG capsule Take 1 capsule (100 mg total) by mouth 2 (two) times daily. 08/04/19   Zigmund Gottron, NP  fluconazole (DIFLUCAN) 150 MG tablet Take 1 tablet (150 mg total) by mouth daily. Repeat in 1 week if needed 04/06/18   Raylene Everts, MD  metroNIDAZOLE (FLAGYL) 500 MG tablet Take 1 tablet (500 mg total) by mouth 2 (two) times daily. 04/06/18   Raylene Everts, MD    Family History Family History  Problem Relation Age of Onset  . Hypertension Mother   . Diabetes Maternal Uncle     Social History Social History   Tobacco Use  . Smoking status: Former Smoker    Types: Cigars  . Smokeless tobacco: Never  Used  . Tobacco comment: 2 cigars per day   Substance Use Topics  . Alcohol use: Yes    Comment: occ  . Drug use: No     Allergies   Patient has no known allergies.   Review of Systems Review of Systems   Physical Exam Triage Vital Signs ED Triage Vitals  Enc Vitals Group     BP 08/04/19 1618 130/66     Pulse Rate 08/04/19 1618 71     Resp 08/04/19 1618 18     Temp 08/04/19 1618 98.6 F (37 C)     Temp Source 08/04/19 1618 Oral     SpO2 08/04/19 1618 100 %     Weight 08/04/19 1617 203 lb (92.1 kg)     Height --      Head Circumference --      Peak Flow --      Pain Score 08/04/19 1617 5     Pain Loc --      Pain Edu? --      Excl. in [place]? --    No data  found.  Updated Vital Signs BP 130/66 (BP Location: Right Arm)   Pulse 71   Temp 98.6 F (37 C) (Oral)   Resp 18   Wt 203 lb (92.1 kg)   SpO2 100%   BMI 31.79 kg/m   Visual Acuity Right Eye Distance:   Left Eye Distance:   Bilateral Distance:    Right Eye Near:   Left Eye Near:    Bilateral Near:     Physical Exam Constitutional:      General: She is not in acute distress.    Appearance: She is well-developed.  Cardiovascular:     Rate and Rhythm: Normal rate.  Pulmonary:     Effort: Pulmonary effort is normal.  Skin:    General: Skin is warm and dry.          Comments: Draining pilonidal abscess to left gluteal cleft with tenderness; purulent drainage   Neurological:     Mental Status: She is alert and oriented to person, place, and time.      UC Treatments / Results  Labs (all labs ordered are listed, but only abnormal results are displayed) Labs Reviewed - No data to display  EKG   Radiology No results found.  Procedures Procedures (including critical care time)  Medications Ordered in UC Medications - No data to display  Initial Impression / Assessment and Plan / UC Course  I have reviewed the triage vital signs and the nursing notes.  Pertinent labs & imaging results  that were available during my care of the patient were reviewed by me and considered in my medical decision making (see chart for details).     Draining pilonidal abscess without indication for further incision at this time. Antibiotics, warm soaks/ compresses recommended with return precautions provided. Discussed may need to have this removed in the future with general surgery as had same in this location multiple other episodes.  Final Clinical Impressions(s) / UC Diagnoses   Final diagnoses:  Pilonidal abscess     Discharge Instructions     Warm soaks/ compresses to promote further drainage.  Complete course of antibiotics.  Once this has improved you may need to follow up with general surgery as this could benefit from surgical removal to prevent recurrence.  Tylenol and/or ibuprofen as needed for pain or fevers.  If symptoms worsen or do not improve in the next week to return to be seen or to follow up with your PCP.     ED Prescriptions    Medication Sig Dispense Auth. Provider   doxycycline (VIBRAMYCIN) 100 MG capsule Take 1 capsule (100 mg total) by mouth 2 (two) times daily. 20 capsule Georgetta Haber, NP     PDMP not reviewed this encounter.   Georgetta Haber, NP 08/04/19 [phone]

## 2019-08-04 NOTE — Discharge Instructions (Signed)
Warm soaks/ compresses to promote further drainage.  Complete course of antibiotics.  Once this has improved you may need to follow up with general surgery as this could benefit from surgical removal to prevent recurrence.  Tylenol and/or ibuprofen as needed for pain or fevers.  If symptoms worsen or do not improve in the next week to return to be seen or to follow up with your PCP.

## 2019-08-04 NOTE — ED Triage Notes (Signed)
Pt state she has abscess on her buttocks. Pt state she has had it for a week or more. Pt state sit has been draining.

## 2021-04-09 ENCOUNTER — Telehealth: Payer: Medicaid Other | Admitting: Physician Assistant

## 2021-04-09 DIAGNOSIS — L0591 Pilonidal cyst without abscess: Secondary | ICD-10-CM

## 2021-04-09 MED ORDER — DOXYCYCLINE HYCLATE 100 MG PO TABS: 100.0000 mg | ORAL_TABLET | Freq: Two times a day (BID) | ORAL | 0 refills | Status: DC

## 2021-04-09 NOTE — Patient Instructions (Signed)
Hassell Halim, thank you for joining Margaretann Loveless, PA-C for today's virtual visit.  While this provider is not your primary care provider (PCP), if your PCP is located in our provider database this encounter information will be shared with them immediately following your visit.  Consent: (Patient) Chastity Noland provided verbal consent for this virtual visit at the beginning of the encounter.  Current Medications:  Current Outpatient Medications:    doxycycline (VIBRA-TABS) 100 MG tablet, Take 1 tablet (100 mg total) by mouth 2 (two) times daily., Disp: 20 tablet, Rfl: 0   fluconazole (DIFLUCAN) 150 MG tablet, Take 1 tablet (150 mg total) by mouth daily. Repeat in 1 week if needed, Disp: 2 tablet, Rfl: 0   metroNIDAZOLE (FLAGYL) 500 MG tablet, Take 1 tablet (500 mg total) by mouth 2 (two) times daily., Disp: 14 tablet, Rfl: 0   Medications ordered in this encounter:  Meds ordered this encounter  Medications   doxycycline (VIBRA-TABS) 100 MG tablet    Sig: Take 1 tablet (100 mg total) by mouth 2 (two) times daily.    Dispense:  20 tablet    Refill:  0    Order Specific Question:   Supervising Provider    Answer:   Hyacinth Meeker, BRIAN [[id number]]     *If you need refills on other medications prior to your next appointment, please contact your pharmacy*  Follow-Up: Call back or seek an in-person evaluation if the symptoms worsen or if the condition fails to improve as anticipated.  Other Instructions Pilonidal Cyst A pilonidal cyst is a fluid-filled sac that forms beneath the skin near the tailbone, at the top of the crease of the buttocks (pilonidal area). If the cyst is not large and not infected, it may not cause any problems. If the cyst becomes irritated or infected, it may get larger and fill with pus. An infected cyst is called an abscess. A pilonidal abscess may cause pain and swelling, and it may need to be drained or removed. What are the causes? The cause of this condition  is not always known. In some cases, a hair that grows into your skin (ingrown hair) may be the cause. What increases the risk? You are more likely to get a pilonidal cyst if you: Are female. Have lots of hair near the crease of the buttocks. Are overweight. Have a dimple near the crease of the buttocks. Wear tight clothing. Do not bathe or shower often. Sit for long periods of time. What are the signs or symptoms? Signs and symptoms of a pilonidal cyst may include pain, swelling, redness, and warmth in the pilonidal area. Depending on how big the cyst is, you may be able to feel a lump near your tailbone. If your cyst becomes infected, symptoms may include: Pus or fluid drainage. Fever. Pain, swelling, and redness getting worse. The lump getting bigger. How is this diagnosed? This condition may be diagnosed based on: Your symptoms and medical history. A physical exam. A blood test to check for infection. Testing a pus sample, if applicable. How is this treated? If your cyst does not cause symptoms, you may not need any treatment. If your cyst bothers you or is infected, you may need a procedure to drain or remove the cyst. Depending on the size, location, and severity of your cyst, your health care provider may: Make an incision in the cyst and drain it (incision and drainage). Open and drain the cyst, and then stitch the wound so that it stays  open while it heals (marsupialization). You will be given instructions about how to care for your open wound while it heals. Remove all or part of the cyst, and then close the wound (cyst removal). You may need to take antibiotic medicines before your procedure. Follow these instructions at home: Medicines Take over-the-counter and prescription medicines only as told by your health care provider. If you were prescribed an antibiotic medicine, take it as told by your health care provider. Do not stop taking the antibiotic even if you start to feel  better. General instructions Keep the area around your pilonidal cyst clean and dry. If there is fluid or pus draining from your cyst: Cover the area with a clean bandage (dressing) as needed. Wash the area gently with soap and water. Pat the area dry with a clean towel. Do not rub the area because that may cause bleeding. Remove hair from the area around the cyst only if your health care provider tells you to do this. Do not wear tight pants or sit in one position for long periods at a time. Keep all follow-up visits as told by your health care provider. This is important. Contact a health care provider if you have: New redness, swelling, or pain. A fever. Severe pain. Summary A pilonidal cyst is a fluid-filled sac that forms beneath the skin near the tailbone, at the top of the crease of the buttocks (pilonidal area). If the cyst becomes irritated or infected, it may get larger and fill with pus. An infected cyst is called an abscess. The cause of this condition is not always known. In some cases, a hair that grows into your skin (ingrown hair) may be the cause. If your cyst does not cause symptoms, you may not need any treatment. If your cyst bothers you or is infected, you may need a procedure to drain or remove the cyst. This information is not intended to replace advice given to you by your health care provider. Make sure you discuss any questions you have with your health care provider. Document Revised: 04/27/2020 Document Reviewed: 04/27/2020 Elsevier Patient Education  2022 [employer].    If you have been instructed to have an in-person evaluation today at a local Urgent Care facility, please use the link below. It will take you to a list of all of our available [facility], including address, phone number and hours of operation. Please do not delay care.  [facility]  If you or a family member do not have a primary care provider, use the link below  to schedule a visit and establish care. When you choose a [facility] primary care physician or advanced practice provider, you gain a long-term partner in health. Find a Primary Care Provider  Learn more about [facility] in-office and virtual care options: [facility] - Get Care Now

## 2021-04-09 NOTE — Progress Notes (Signed)
Virtual Visit Consent   April Barber, you are scheduled for a virtual visit with a [facility] provider today.     Just as with appointments in the office, your consent must be obtained to participate.  Your consent will be active for this visit and any virtual visit you may have with one of our providers in the next 365 days.     If you have a MyChart account, a copy of this consent can be sent to you electronically.  All virtual visits are billed to your insurance company just like a traditional visit in the office.    As this is a virtual visit, video technology does not allow for your provider to perform a traditional examination.  This may limit your provider's ability to fully assess your condition.  If your provider identifies any concerns that need to be evaluated in person or the need to arrange testing (such as labs, EKG, etc.), we will make arrangements to do so.     Although advances in technology are sophisticated, we cannot ensure that it will always work on either your end or our end.  If the connection with a video visit is poor, the visit may have to be switched to a telephone visit.  With either a video or telephone visit, we are not always able to ensure that we have a secure connection.     I need to obtain your verbal consent now.   Are you willing to proceed with your visit today?    April Barber has provided verbal consent on 04/09/2021 for a virtual visit (video or telephone).   Margaretann Loveless, PA-C   Date: 04/09/2021 12:37 PM   Virtual Visit via Video Note   I, Margaretann Loveless, connected with  April Barber  ([id number], 1996/07/01) on 04/09/21 at 12:30 PM EDT by a video-enabled telemedicine application and verified that I am speaking with the correct person using two identifiers.  Location: Patient: Virtual Visit Location Patient: Home Provider: Virtual Visit Location Provider: Home Office   I discussed the limitations of evaluation and  management by telemedicine and the availability of in person appointments. The patient expressed understanding and agreed to proceed.    History of Present Illness: April Barber is a 25 y.o. who identifies as a female who was assigned female at birth, and is being seen today for cyst between buttocks. This has been a recurring issue since 2013, but has started becoming more frequent and almost continuous. She has been trying warm compresses, hot showers with some mild relief in pain. Uncomfortable to stand or sit, lying down on side or stomach makes most comfortable. Denies drainage. She denies any systemic symptoms including fever, chills, nausea, or vomiting.   Problems:  Patient Active Problem List   Diagnosis Date Noted   Encounter for surveillance of abnormal nevi 02/27/2017   Encounter for IUD insertion 02/27/2017   Marijuana use 08/06/2016    Allergies: No Known Allergies Medications:  Current Outpatient Medications:    doxycycline (VIBRA-TABS) 100 MG tablet, Take 1 tablet (100 mg total) by mouth 2 (two) times daily., Disp: 20 tablet, Rfl: 0   fluconazole (DIFLUCAN) 150 MG tablet, Take 1 tablet (150 mg total) by mouth daily. Repeat in 1 week if needed, Disp: 2 tablet, Rfl: 0   metroNIDAZOLE (FLAGYL) 500 MG tablet, Take 1 tablet (500 mg total) by mouth 2 (two) times daily., Disp: 14 tablet, Rfl: 0  Observations/Objective: Patient is well-developed, well-nourished in no acute distress.  Resting comfortably at home.  Head is normocephalic, atraumatic.  No labored breathing.  Speech is clear and coherent with logical content.  Patient is alert and oriented at baseline.    Assessment and Plan: 1. Pilonidal cyst - doxycycline (VIBRA-TABS) 100 MG tablet; Take 1 tablet (100 mg total) by mouth 2 (two) times daily.  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 The patient was provided an opportunity to ask questions and all were answered. The patient agreed with the plan and demonstrated an understanding of the instructions.  A copy of instructions were sent to the patient via MyChart unless otherwise noted below.    The patient was advised to call back or seek an in-person evaluation if the symptoms worsen or if the condition fails to improve as anticipated.  Time:  I spent 13 minutes with the patient via telehealth technology discussing the above problems/concerns.    Margaretann Loveless, PA-C

## 2021-05-25 ENCOUNTER — Encounter (HOSPITAL_COMMUNITY): Payer: Self-pay | Admitting: Emergency Medicine

## 2021-05-25 ENCOUNTER — Ambulatory Visit (INDEPENDENT_AMBULATORY_CARE_PROVIDER_SITE_OTHER): Payer: Medicaid Other

## 2021-05-25 ENCOUNTER — Ambulatory Visit (HOSPITAL_COMMUNITY)
Admission: EM | Admit: 2021-05-25 | Discharge: 2021-05-25 | Disposition: A | Discharge status: Home / Self Care | Payer: Medicaid Other | Attending: Emergency Medicine | Admitting: Emergency Medicine

## 2021-05-25 ENCOUNTER — Other Ambulatory Visit: Payer: Self-pay

## 2021-05-25 DIAGNOSIS — S90851A Superficial foreign body, right foot, initial encounter: Secondary | ICD-10-CM | POA: Diagnosis not present

## 2021-05-25 MED ORDER — NAPROXEN 500 MG PO TABS: 500.0000 mg | ORAL_TABLET | Freq: Two times a day (BID) | ORAL | 0 refills | Status: DC

## 2021-05-25 NOTE — ED Triage Notes (Signed)
Pt reports got piece of glass in her right foot last Friday. Reports painful with weight bearing.

## 2021-05-25 NOTE — Discharge Instructions (Addendum)
Take Naprosyn twice a day with 1000 mg of Tylenol.  There is a very small sliver of glass in your foot.  It is difficult to tell exactly how deep it is, but because it is small and not a superficial foreign body, do not think that removal here would be very successful.  I will leave this to the podiatrist.  It may work itself out.  I do not think continued antibiotics today please follow-up with [facility] if it continues to bother you.

## 2021-05-25 NOTE — ED Provider Notes (Signed)
HPI  SUBJECTIVE:  April Barber is a 25 y.o. female who presents with a small laceration in continued pain in the sole of her right foot after stepping on some glass 1 week ago.  She denies foreign body sensation, surrounding erythema, purulent drainage, body aches, fevers, swelling.  She has tried Tylenol, ibuprofen, cleaning it with alcohol and applying Neosporin without improvement in her symptoms.  Symptoms worse with standing, walking and with wearing shoes.  She has no past medical history.  LMP: IUD.  She denies the possibility of being pregnant.  [facility]: [facility]  Past Medical History:  Diagnosis Date   Medical history non-contributory     Past Surgical History:  Procedure Laterality Date   NO PAST SURGERIES      Family History  Problem Relation Age of Onset   Hypertension Mother    Diabetes Maternal Uncle     Social History   Tobacco Use   Smoking status: Former    Types: Cigars   Smokeless tobacco: Never   Tobacco comments:    2 cigars per day   Vaping Use   Vaping Use: Never used  Substance Use Topics   Alcohol use: Yes    Comment: occ   Drug use: No    No current facility-administered medications for this encounter.  Current Outpatient Medications:    naproxen (NAPROSYN) 500 MG tablet, Take 1 tablet (500 mg total) by mouth 2 (two) times daily., Disp: 20 tablet, Rfl: 0  No Known Allergies   ROS  As noted in HPI.   Physical Exam  BP 109/69 (BP Location: Right Arm)   Pulse 79   Temp 98.2 F (36.8 C) (Oral)   Resp 16   SpO2 99%   Constitutional: Well developed, well nourished, no acute distress Eyes:  EOMI, conjunctiva normal bilaterally HENT: Normocephalic, atraumatic,mucus membranes moist Respiratory: Normal inspiratory effort Cardiovascular: Normal rate GI: nondistended skin: No rash, skin intact Musculoskeletal: Small, mildly tender healing laceration sole of right foot.  No surrounding erythema, induration.  No  expressible purulent drainage.  No palpable hard mass in the laceration. Neurologic: Alert & oriented x 3, no focal neuro deficits Psychiatric: Speech and behavior appropriate   ED Course   Medications - No data to display  Orders Placed This Encounter  Procedures   DG Foot Complete Right    Standing Status:   Standing    Number of Occurrences:   1    Order Specific Question:   Reason for Exam (SYMPTOM  OR DIAGNOSIS REQUIRED)    Answer:   Rule out foreign body.  Patient stepped on glass 1 week ago    No results found for this or any previous visit (from the past 24 hour(s)). DG Foot Complete Right  Result Date: 05/25/2021 CLINICAL DATA:  Stepped on glass 1 week ago.  Rule out foreign body. EXAM: RIGHT FOOT COMPLETE - 3+ VIEW COMPARISON:  None. FINDINGS: Small radiopaque foreign body projects near the base of the right 5th metatarsal on the oblique view. This is not seen on additional views and therefore difficult to determine exact location, but suspected in the lateral plantar soft tissues. No fracture, subluxation or dislocation. IMPRESSION: Small radiopaque foreign body projects adjacent to the base of the 5th metatarsal on the oblique view. Electronically Signed   By: Charlett Nose M.D.   On: 05/25/2021 08:42    ED Clinical Impression  1. Foreign body of skin of plantar aspect of right foot  ED Assessment/Plan  It does not appear to be infected.  There is no expressible purulent drainage or palpable hard foreign body.  Patient states she does not feel like there is anything in there.  However, will check x-ray to rule out foreign body.  Discussed with patient that x-ray does not pick up all glass.  Reviewed imaging independently.  Small radiopaque foreign body adjacent to the base of the fifth metatarsal.  Difficult to judge the depth.  See radiology report for full details.  Patient with a small retained sliver in her foot.  I am unsure as to exactly how deep it is, but it  does not appear to be very large.  Since it is not close to the surface, where it is easily retrievable, will defer removal to a specialist. will have patient follow-up with podiatry.  Home with Naprosyn, Tylenol, soap/water, follow-up with the [facility] if not better in several weeks.   Discussed imaging, MDM, treatment plan, and plan for follow-up with patient.  patient agrees with plan.   Meds ordered this encounter  Medications   naproxen (NAPROSYN) 500 MG tablet    Sig: Take 1 tablet (500 mg total) by mouth 2 (two) times daily.    Dispense:  20 tablet    Refill:  0      *This clinic note was created using Scientist, clinical (histocompatibility and immunogenetics). Therefore, there may be occasional mistakes despite careful proofreading.  ?    Domenick Gong, MD 05/26/21 [phone]

## 2021-05-30 ENCOUNTER — Ambulatory Visit: Payer: Medicaid Other | Admitting: Podiatry

## 2021-05-30 ENCOUNTER — Encounter: Payer: Self-pay | Admitting: Podiatry

## 2021-05-30 ENCOUNTER — Other Ambulatory Visit: Payer: Self-pay

## 2021-05-30 DIAGNOSIS — L0889 Other specified local infections of the skin and subcutaneous tissue: Secondary | ICD-10-CM | POA: Diagnosis not present

## 2021-05-30 DIAGNOSIS — L923 Foreign body granuloma of the skin and subcutaneous tissue: Secondary | ICD-10-CM | POA: Diagnosis not present

## 2021-05-30 NOTE — Progress Notes (Signed)
Subjective:   Patient ID: April Barber, female   DOB: 25 y.o.   MRN: [id number]   HPI Patient presents stating she stepped on a piece of glass on the bottom of her right foot and its been very tender.  She went to the emergency room they were not able to do this and she is referred to [place] for deep procedure in order to hopefully get this foreign body out of here.  Patient does not smoke likes to be active and is having trouble walking on her foot   Review of Systems  All other systems reviewed and are negative.      Objective:  Physical Exam Vitals and nursing note reviewed.  Constitutional:      Appearance: She is well-developed.  Pulmonary:     Effort: Pulmonary effort is normal.  Musculoskeletal:        General: Normal range of motion.  Skin:    General: Skin is warm.  Neurological:     Mental Status: She is alert.    Neurovascular status found to be within normal limits with patient found to have significant trauma plantar aspect right lateral foot with a approximate 5 mm opening with no drainage no erythema edema but quite a bit of pain when palpated     Assessment:  Probability for a piece of glass which lodged several weeks ago secondary to a glass breaking with trauma     Plan:  H&P reviewed condition and x-ray.  At this point I did anesthetized with 120 mg liken Marcaine mixture sterile prep to the right lateral foot and using sterile limitation I made a small incision plantar aspect right foot.  Utilizing hemostat and pickups I was able to identify the foreign body and remove it in toto and then flushed the wound copious posterior Meissen solution applied sterile dressing instructed on elevation and to begin soaks in 2 days.  If any other pathology were to occur patient is to let us know immediately and if any redness swelling or any indications of infection she is to go to the emergency room  X-ray indicated a small foreign body probable glass plantar lateral aspect  right foot

## 2021-06-04 ENCOUNTER — Encounter: Payer: Self-pay | Admitting: Podiatry

## 2021-06-04 ENCOUNTER — Telehealth: Payer: Self-pay | Admitting: Podiatry

## 2021-06-04 NOTE — Telephone Encounter (Signed)
Patient called she needs the return to work to be more specific with her restriction : it has to say that  she has to have a sit down job for the next 3 days.  And she needs a note saying the we took her out of work for  Saturday and Today, because they would not let her return to work with the letter that we have her. Her job requires the letter to be with specific restrictions on it.    Please call patient when letter is ready and she will come in and get it.

## 2021-06-04 NOTE — Telephone Encounter (Signed)
Fine to word note as she is requesting

## 2021-06-28 ENCOUNTER — Ambulatory Visit (HOSPITAL_COMMUNITY)
Admission: RE | Admit: 2021-06-28 | Discharge: 2021-06-28 | Disposition: A | Discharge status: Home / Self Care | Payer: Medicaid Other | Source: Ambulatory Visit | Attending: Student | Admitting: Student

## 2021-06-28 ENCOUNTER — Encounter (HOSPITAL_COMMUNITY): Payer: Self-pay

## 2021-06-28 ENCOUNTER — Other Ambulatory Visit: Payer: Self-pay

## 2021-06-28 VITALS — BP 123/82 | HR 67 | Temp 98.3°F | Resp 16

## 2021-06-28 DIAGNOSIS — L0501 Pilonidal cyst with abscess: Secondary | ICD-10-CM | POA: Diagnosis not present

## 2021-06-28 DIAGNOSIS — Z975 Presence of (intrauterine) contraceptive device: Secondary | ICD-10-CM | POA: Diagnosis not present

## 2021-06-28 DIAGNOSIS — U071 COVID-19: Secondary | ICD-10-CM

## 2021-06-28 MED ORDER — SULFAMETHOXAZOLE-TRIMETHOPRIM 800-160 MG PO TABS: 1.0000 | ORAL_TABLET | Freq: Two times a day (BID) | ORAL | 0 refills | Status: AC

## 2021-06-28 MED ORDER — FLUTICASONE PROPIONATE 50 MCG/ACT NA SUSP: 2.0000 | Freq: Every day | NASAL | 2 refills | Status: DC

## 2021-06-28 NOTE — Discharge Instructions (Addendum)
-  Flonase nasal steroid: place 2 sprays into both nostrils in the morning and at bedtime for at least 7 days. Continue for longer if this is helping. -With a virus, you're typically contagious for 5-7 days, or as long as you're having fevers.  -Start the antibiotic-Bactrim (trimethoprim-sulfamethoxazole) twice daily x7 days. Take with food if you have a sensitive stomach.

## 2021-06-28 NOTE — ED Provider Notes (Signed)
[facility]    CSN: [id number] Arrival date & time: 06/28/21  1137      History   Chief Complaint Chief Complaint  Patient presents with   Covid Positive   Abscess   Appointment    HPI April Barber is a 25 y.o. female presenting with COVID and recurrent pilonidal cyst.  Medical history recurrent pilonidal abscess.  Denies history of pulmonary disease. -States viral symptoms since 12/22, including loss of taste, nasal congestion, generalized body aches.  Home COVID test today 12/29 was positive.  States congestion has persisted, but denies cough, shortness of breath, chest pain, fever/chills.  Attempted alka seltzer, nyquil with some improvement. Needs a work note. -Also with recurrent pilonidal abscess for years.  Was last seen for this 03/2021 by primary care with, was prescribed doxycycline, the cyst resolved before she completed course and so she stopped this early.  States that when the abscess started forming again 3 days ago, she took 3 more tabs of doxycycline that she had at home, this helped somewhat and the abscess started draining, but she still has pain around the area.  States that she is finally ready to be seen by a Careers adviser for this.  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 Authorizing Provider  fluticasone (FLONASE) 50 MCG/ACT nasal spray Place 2 sprays into both nostrils  daily. 06/28/21  Yes Rhys Martini, PA-C  sulfamethoxazole-trimethoprim (BACTRIM DS) 800-160 MG tablet Take 1 tablet by mouth 2 (two) times daily for 7 days. 06/28/21 07/05/21 Yes Rhys Martini, PA-C    Family History Family History  Problem Relation Age of Onset   Hypertension Mother    Diabetes Maternal Uncle     Social History Social History   Tobacco Use   Smoking status: Former    Types: Cigars   Smokeless tobacco: Never   Tobacco comments:    2 cigars per day   Vaping Use   Vaping Use: Never used  Substance Use Topics   Alcohol use: Yes    Comment: occ   Drug use: No     Allergies   Patient has no known allergies.   Review of Systems Review of Systems  Constitutional:  Negative for appetite change, chills and fever.  HENT:  Positive for congestion. Negative for ear pain, rhinorrhea, sinus pressure, sinus pain and sore throat.   Eyes:  Negative for redness and visual disturbance.  Respiratory:  Negative for cough, chest tightness, shortness of breath and wheezing.   Cardiovascular:  Negative for chest pain and palpitations.  Gastrointestinal:  Negative for abdominal  pain, constipation, diarrhea, nausea and vomiting.  Genitourinary:  Negative for dysuria, frequency and urgency.  Musculoskeletal:  Negative for myalgias.  Skin:  Positive for color change.  Neurological:  Negative for dizziness, weakness and headaches.  Psychiatric/Behavioral:  Negative for confusion.   All other systems reviewed and are negative.   Physical Exam Triage Vital Signs ED Triage Vitals  Enc Vitals Group     BP 06/28/21 1202 123/82     Pulse Rate 06/28/21 1202 67     Resp 06/28/21 1202 16     Temp 06/28/21 1202 98.3 F (36.8 C)     Temp Source 06/28/21 1202 Oral     SpO2 06/28/21 1202 95 %     Weight --      Height --      Head Circumference --      Peak Flow --      Pain Score 06/28/21 1201 0     Pain Loc --      Pain Edu? --      Excl. in [place]? --    No data  found.  Updated Vital Signs BP 123/82 (BP Location: Left Arm)    Pulse 67    Temp 98.3 F (36.8 C) (Oral)    Resp 16    SpO2 95%   Visual Acuity Right Eye Distance:   Left Eye Distance:   Bilateral Distance:    Right Eye Near:   Left Eye Near:    Bilateral Near:     Physical Exam Vitals reviewed.  Constitutional:      General: She is not in acute distress.    Appearance: Normal appearance. She is not ill-appearing.  HENT:     Head: Normocephalic and atraumatic.     Right Ear: Tympanic membrane, ear canal and external ear normal. No tenderness. No middle ear effusion. There is no impacted cerumen. Tympanic membrane is not perforated, erythematous, retracted or bulging.     Left Ear: Tympanic membrane, ear canal and external ear normal. No tenderness.  No middle ear effusion. There is no impacted cerumen. Tympanic membrane is not perforated, erythematous, retracted or bulging.     Nose: Congestion present.     Mouth/Throat:     Mouth: Mucous membranes are moist.     Pharynx: Uvula midline. No oropharyngeal exudate or posterior oropharyngeal erythema.  Eyes:     Extraocular Movements: Extraocular movements intact.     Pupils: Pupils are equal, round, and reactive to light.  Cardiovascular:     Rate and Rhythm: Normal rate and regular rhythm.     Heart sounds: Normal heart sounds.  Pulmonary:     Effort: Pulmonary effort is normal.     Breath sounds: Normal breath sounds. No decreased breath sounds, wheezing, rhonchi or rales.  Abdominal:     Palpations: Abdomen is soft.     Tenderness: There is no abdominal tenderness. There is no guarding or rebound.  Lymphadenopathy:     Cervical: No cervical adenopathy.     Right cervical: No superficial cervical adenopathy.    Left cervical: No superficial cervical adenopathy.  Skin:    Comments: R gluteal cleft: 1cm area of fluctuance and spontaneous drainage, with surrounding erythema and tenderness. Scant purulent discharge expressed.   Neurological:     General: No focal deficit present.     Mental Status: She is alert and oriented to Barber, place, and time.  Psychiatric:        Mood and Affect: Mood normal.  Behavior: Behavior normal.        Thought Content: Thought content normal.        Judgment: Judgment normal.     UC Treatments / Results  Labs (all labs ordered are listed, but only abnormal results are displayed) Labs Reviewed - No data to display  EKG   Radiology No results found.  Procedures Procedures (including critical care time)  Medications Ordered in UC Medications - No data to display  Initial Impression / Assessment and Plan / UC Course  I have reviewed the triage vital signs and the nursing notes.  Pertinent labs & imaging results that were available during my care of the patient were reviewed by me and considered in my medical decision making (see chart for details).     This patient is a very pleasant 25 y.o. year old female presenting with recurrent pilonidal abscess and covid-19. Afebrile, nontachy. IUD contraception.   Pilonidal abscess is spontaneously draining, Bactrim sent as she just completed doxycycline recently.  Follow-up with PCP for referral to general surgery.  COVID-19 is also resolving on its own, symptoms for 7 days.  Lingering nasal congestion.  Flonase sent, note to return to work provided today.  ED return precautions discussed. Patient verbalizes understanding and agreement.     Final Clinical Impressions(s) / UC Diagnoses   Final diagnoses:  COVID-19  Pilonidal abscess  IUD contraception     Discharge Instructions      -Flonase nasal steroid: place 2 sprays into both nostrils in the morning and at bedtime for at least 7 days. Continue for longer if this is helping. -With a virus, you're typically contagious for 5-7 days, or as long as you're having fevers.  -Start the antibiotic-Bactrim (trimethoprim-sulfamethoxazole) twice daily x7 days.  Take with food if you have a sensitive stomach.       ED Prescriptions     Medication Sig Dispense Auth. Provider   fluticasone (FLONASE) 50 MCG/ACT nasal spray Place 2 sprays into both nostrils daily. 15 mL Hazel Sams, PA-C   sulfamethoxazole-trimethoprim (BACTRIM DS) 800-160 MG tablet Take 1 tablet by mouth 2 (two) times daily for 7 days. 14 tablet Hazel Sams, PA-C      PDMP not reviewed this encounter.   Hazel Sams, PA-C 06/28/21 1233

## 2021-06-28 NOTE — ED Triage Notes (Signed)
Patient reports she started feeling bad 12/22, loss of taste, runny nose, bodyaches--no fever or cough. Took at home COVID test today and was positive. Patient states that she does feel like she is getting better. Has missed work this week and needs note to return. Still has runny nose continues with no fever.   Patient also has abscess that she was seen on tailbone, was prescribed antibiotic and has completed course. States that did reduce in size but was not lanced. Would like to have it area checked.

## 2022-02-10 ENCOUNTER — Encounter (HOSPITAL_COMMUNITY): Payer: Self-pay

## 2022-02-10 ENCOUNTER — Other Ambulatory Visit: Payer: Self-pay

## 2022-02-10 ENCOUNTER — Emergency Department (HOSPITAL_COMMUNITY)
Admission: EM | Admit: 2022-02-10 | Discharge: 2022-02-11 | Disposition: A | Discharge status: Home / Self Care | Payer: Medicaid Other | Attending: Emergency Medicine | Admitting: Emergency Medicine

## 2022-02-10 DIAGNOSIS — R102 Pelvic and perineal pain: Secondary | ICD-10-CM

## 2022-02-10 DIAGNOSIS — N760 Acute vaginitis: Secondary | ICD-10-CM | POA: Insufficient documentation

## 2022-02-10 DIAGNOSIS — B9689 Other specified bacterial agents as the cause of diseases classified elsewhere: Secondary | ICD-10-CM | POA: Diagnosis not present

## 2022-02-10 DIAGNOSIS — Z87891 Personal history of nicotine dependence: Secondary | ICD-10-CM | POA: Insufficient documentation

## 2022-02-10 DIAGNOSIS — Z975 Presence of (intrauterine) contraceptive device: Secondary | ICD-10-CM | POA: Diagnosis not present

## 2022-02-10 NOTE — ED Provider Triage Note (Signed)
Emergency Medicine Provider Triage Evaluation Note  April Barber , a 26 y.o. female  was evaluated in triage.  Pt complains of lower abdominal pain for the past week.  Some associated nausea/vomiting.  Feels like it may be related to her IUD as this is supposed to be changed soon.  Also reports some vaginal discharge and odor.  Denies any urinary symptoms..  Review of Systems  Positive: Abdominal pain, vaginal discharge/odor Negative: fever  Physical Exam  BP (!) 151/100   Pulse 76   Temp 99.1 F (37.3 C) (Oral)   Resp 16   SpO2 100%  Gen:   Awake, no distress   Resp:  Normal effort  MSK:   Moves extremities without difficulty  Other:    Medical Decision Making  Medically screening exam initiated at 11:11 PM.  Appropriate orders placed.  April Barber was informed that the remainder of the evaluation will be completed by another provider, this initial triage assessment does not replace that evaluation, and the importance of remaining in the ED until their evaluation is complete.  Lower abdominal pain, vaginal discharge.  Feels like may be related to IUD.  Will check labs, UA.   April Hatchet, PA-C 02/10/22 2313

## 2022-02-10 NOTE — ED Triage Notes (Signed)
Pt has been having lower abd pain N&V for one week. IUD was supposed to be replaced but she has not gotten it done yet. She noticed vag bleeding, discharge and an odor.

## 2022-02-11 ENCOUNTER — Emergency Department (HOSPITAL_COMMUNITY): Payer: Medicaid Other

## 2022-02-11 DIAGNOSIS — Z975 Presence of (intrauterine) contraceptive device: Secondary | ICD-10-CM | POA: Diagnosis not present

## 2022-02-11 DIAGNOSIS — R102 Pelvic and perineal pain: Secondary | ICD-10-CM

## 2022-02-11 LAB — COMPREHENSIVE METABOLIC PANEL
ALT: 22 U/L (ref 0–44)
AST: 21 U/L (ref 15–41)
Albumin: 3.9 g/dL (ref 3.5–5.0)
Alkaline Phosphatase: 74 U/L (ref 38–126)
Anion gap: 10 (ref 5–15)
BUN: 11 mg/dL (ref 6–20)
CO2: 26 mmol/L (ref 22–32)
Calcium: 9.1 mg/dL (ref 8.9–10.3)
Chloride: 102 mmol/L (ref 98–111)
Creatinine, Ser: 1.13 mg/dL — ABNORMAL HIGH (ref 0.44–1.00)
GFR, Estimated: >60 mL/min (ref >60)
Glucose, Bld: 97 mg/dL (ref 70–99)
Potassium: 3.8 mmol/L (ref 3.5–5.1)
Sodium: 138 mmol/L (ref 135–145)
Total Bilirubin: 0.8 mg/dL (ref 0.3–1.2)
Total Protein: 7.2 g/dL (ref 6.5–8.1)

## 2022-02-11 LAB — CBC WITH DIFFERENTIAL/PLATELET
Abs Immature Granulocytes: 0.02 10*3/uL (ref 0.00–0.07)
Basophils Absolute: 0 10*3/uL (ref 0.0–0.1)
Basophils Relative: 0 %
Eosinophils Absolute: 0.1 10*3/uL (ref 0.0–0.5)
Eosinophils Relative: 2 %
HCT: 43.6 % (ref 36.0–46.0)
Hemoglobin: 14.1 g/dL (ref 12.0–15.0)
Immature Granulocytes: 0 %
Lymphocytes Relative: 21 %
Lymphs Abs: 1.9 10*3/uL (ref 0.7–4.0)
MCH: 28.2 pg (ref 26.0–34.0)
MCHC: 32.3 g/dL (ref 30.0–36.0)
MCV: 87.2 fL (ref 80.0–100.0)
Monocytes Absolute: 0.6 10*3/uL (ref 0.1–1.0)
Monocytes Relative: 7 %
Neutro Abs: 6.1 10*3/uL (ref 1.7–7.7)
Neutrophils Relative %: 70 %
Platelets: 244 10*3/uL (ref 150–400)
RBC: 5 MIL/uL (ref 3.87–5.11)
RDW: 14.6 % (ref 11.5–15.5)
WBC: 8.8 10*3/uL (ref 4.0–10.5)
nRBC: 0 % (ref 0.0–0.2)

## 2022-02-11 LAB — URINALYSIS, ROUTINE W REFLEX MICROSCOPIC
Bacteria, UA: NONE SEEN
Bilirubin Urine: NEGATIVE
Glucose, UA: NEGATIVE mg/dL
Ketones, ur: NEGATIVE mg/dL
Nitrite: NEGATIVE
Protein, ur: NEGATIVE mg/dL
Specific Gravity, Urine: 1.024 (ref 1.005–1.030)
pH: 6 (ref 5.0–8.0)

## 2022-02-11 LAB — WET PREP, GENITAL
Sperm: NONE SEEN
Trich, Wet Prep: NONE SEEN
WBC, Wet Prep HPF POC: >=10 — AB (ref <10)
Yeast Wet Prep HPF POC: NONE SEEN

## 2022-02-11 LAB — HCG, QUANTITATIVE, PREGNANCY: hCG, Beta Chain, Quant, S: 4 m[IU]/mL (ref <5)

## 2022-02-11 LAB — LIPASE, BLOOD: Lipase: 26 U/L (ref 11–51)

## 2022-02-11 MED ORDER — KETOROLAC TROMETHAMINE 10 MG PO TABS: 10.0000 mg | ORAL_TABLET | Freq: Four times a day (QID) | ORAL | 0 refills | Status: DC | PRN

## 2022-02-11 MED ORDER — METRONIDAZOLE 500 MG PO TABS: 500.0000 mg | ORAL_TABLET | Freq: Two times a day (BID) | ORAL | 0 refills | Status: DC

## 2022-02-11 MED ORDER — DOXYCYCLINE HYCLATE 100 MG PO CAPS: 100.0000 mg | ORAL_CAPSULE | Freq: Two times a day (BID) | ORAL | 0 refills | Status: AC

## 2022-02-11 MED ORDER — KETOROLAC TROMETHAMINE 30 MG/ML IJ SOLN
30.0000 mg | Freq: Once | INTRAMUSCULAR | Status: AC
  Administered 2022-02-11: 30 mg via INTRAMUSCULAR
  Filled 2022-02-11: qty 1

## 2022-02-11 MED ORDER — CEFTRIAXONE SODIUM 500 MG IJ SOLR
500.0000 mg | Freq: Once | INTRAMUSCULAR | Status: AC
  Administered 2022-02-11: 500 mg via INTRAMUSCULAR
  Filled 2022-02-11: qty 500

## 2022-02-11 MED ORDER — LIDOCAINE HCL (PF) 1 % IJ SOLN
INTRAMUSCULAR | Status: AC
  Administered 2022-02-11: 1 mL
  Filled 2022-02-11: qty 5

## 2022-02-11 NOTE — ED Notes (Signed)
Pt reports constant cramping that feels more like "contractions" X5 days.  OTC medication not helping w/ pain.  No other symptoms at this time.

## 2022-02-11 NOTE — ED Notes (Signed)
Pt still at ultrasound

## 2022-02-11 NOTE — ED Notes (Signed)
Patient given 2 pillows.

## 2022-02-11 NOTE — Discharge Instructions (Addendum)
Take doxycycline as prescribed and complete the full course. Flagyl for bacterial vaginosis (do not drink alcohol while taking this medication). Take Toradol as needed directed for pain. Turn to ER for fever and worsening pain.  Follow-up with your gynecologist for IUD follow-up.

## 2022-02-11 NOTE — ED Provider Notes (Signed)
[facility] EMERGENCY DEPARTMENT Provider Note   CSN: [id number] Arrival date & time: 02/10/22  2129     History  Chief Complaint  Patient presents with   Abdominal Pain    April Barber is a 26 y.o. female.  26 year old female with complaint of pelvic pain x 1 week with vaginal odor. Denies associated nausea, vomiting, changes in bowel or bladder habits, fevers, chills. No new partners. Has IUD that is due to be removed this month.        Home Medications Prior to Admission medications   Medication Sig Start Date End Date Taking? Authorizing Provider  doxycycline (VIBRAMYCIN) 100 MG capsule Take 1 capsule (100 mg total) by mouth 2 (two) times daily for 7 days. 02/11/22 02/18/22 Yes Jeannie Fend, PA-C  ketorolac (TORADOL) 10 MG tablet Take 1 tablet (10 mg total) by mouth every 6 (six) hours as needed. 02/11/22  Yes Jeannie Fend, PA-C  metroNIDAZOLE (FLAGYL) 500 MG tablet Take 1 tablet (500 mg total) by mouth 2 (two) times daily. 02/11/22  Yes Jeannie Fend, PA-C  fluticasone (FLONASE) 50 MCG/ACT nasal spray Place 2 sprays into both nostrils daily. 06/28/21   Rhys Martini, PA-C      Allergies    Patient has no known allergies.    Review of Systems   Review of Systems Negative except as per HPI Physical Exam Updated Vital Signs BP 136/79   Pulse 63   Temp 98.4 F (36.9 C) (Oral)   Resp 16   Ht 5\' 7"  (1.702 m)   Wt 99.8 kg   SpO2 100%   BMI 34.46 kg/m  Physical Exam Vitals and nursing note reviewed. Exam conducted with a chaperone present.  Constitutional:      General: She is not in acute distress.    Appearance: She is well-developed. She is not diaphoretic.     Comments: Appears uncomfortable  HENT:     Head: Normocephalic and atraumatic.  Cardiovascular:     Rate and Rhythm: Normal rate and regular rhythm.     Heart sounds: Normal heart sounds.  Pulmonary:     Effort: Pulmonary effort is normal.     Breath sounds: Normal  breath sounds.  Abdominal:     Tenderness: There is abdominal tenderness in the right lower quadrant, suprapubic area and left lower quadrant. There is no right CVA tenderness or left CVA tenderness.  Genitourinary:    Cervix: Discharge present. No cervical motion tenderness.     Uterus: Not enlarged and not tender.      Adnexa: Right adnexa normal and left adnexa normal.     Comments: IUD strings visible Skin:    General: Skin is warm and dry.  Neurological:     Mental Status: She is alert and oriented to person, place, and time.  Psychiatric:        Behavior: Behavior normal.     ED Results / Procedures / Treatments   Labs (all labs ordered are listed, but only abnormal results are displayed) Labs Reviewed  WET PREP, GENITAL - Abnormal; Notable for the following components:      Result Value   Clue Cells Wet Prep HPF POC PRESENT (*)    WBC, Wet Prep HPF POC >=10 (*)    All other components within normal limits  COMPREHENSIVE METABOLIC PANEL - Abnormal; Notable for the following components:   Creatinine, Ser 1.13 (*)    All other components within normal limits  URINALYSIS, ROUTINE W REFLEX MICROSCOPIC - Abnormal; Notable for the following components:   APPearance HAZY (*)    Hgb urine dipstick MODERATE (*)    Leukocytes,Ua LARGE (*)    All other components within normal limits  CBC WITH DIFFERENTIAL/PLATELET  LIPASE, BLOOD  HCG, QUANTITATIVE, PREGNANCY  GC/CHLAMYDIA PROBE AMP ([place]) NOT AT [facility]    EKG None  Radiology US PELVIC COMPLETE W TRANSVAGINAL AND TORSION R/O  Result Date: 02/11/2022 CLINICAL DATA:  Pelvic pain. EXAM: TRANSABDOMINAL AND TRANSVAGINAL ULTRASOUND OF PELVIS DOPPLER ULTRASOUND OF OVARIES TECHNIQUE: Both transabdominal and transvaginal ultrasound examinations of the pelvis were performed. Transabdominal technique was performed for global imaging of the pelvis including uterus, ovaries, adnexal regions, and pelvic cul-de-sac. It was necessary to  proceed with endovaginal exam following the transabdominal exam to visualize the endometrium and ovaries better. Color and duplex Doppler ultrasound was utilized to evaluate blood flow to the ovaries. COMPARISON:  No relevant prior. The only prior pelvic ultrasounds were Ob ultrasounds. There is no CT abdomen and pelvis on file. FINDINGS: Uterus Measurements: 8.7 x 4.4 x 4.6 cm = volume: 91.60 mL. No fibroids or other mass visualized. There is an IUD which at least in the most proximal uterine cavity appears to be intrauterine, but the more distal components are dorsal to the endometrial layer and appear to be within the muscle. At least on the images provided there does not appear to be serosal penetration through into the abdominal cavity but CT would be more sensitive for this. The cervix is closed and is unremarkable. Endometrium Thickness: 6.6 mm. No focal abnormality visualized. There is no intrauterine fluid collection or mass. Right ovary Measurements: 3.2 x 1.4 x 2.0 cm = volume: 4.4 mL. Normal appearance/no adnexal mass. Left ovary Measurements: 2.5 x 1.9 x 1.9 cm = volume: 4.7 mL. Normal appearance/no adnexal mass. Pulsed Doppler evaluation of both ovaries demonstrates normal low-resistance arterial and venous waveforms. Other findings There is a small volume anechoic fluid in the pelvic cul-de-sac. No complex fluid or adnexal mass are seen. IMPRESSION: 1. The distal IUD is dorsal to the endometrium within the musculature. 2. On the provided images there is no appreciable penetration through the serosa into the abdominal cavity but CT would be more sensitive for this. 3. Small amount of anechoic pelvic cul-de-sac fluid. Electronically Signed   By: Almira Bar M.D.   On: 02/11/2022 07:02    Procedures Procedures    Medications Ordered in ED Medications  ketorolac (TORADOL) 30 MG/ML injection 30 mg (30 mg Intramuscular Given 02/11/22 0847)  cefTRIAXone (ROCEPHIN) injection 500 mg (500 mg  Intramuscular Given 02/11/22 0845)  lidocaine (PF) (XYLOCAINE) 1 % injection (1 mL  Given 02/11/22 0846)    ED Course/ Medical Decision Making/ A&P                           Medical Decision Making Amount and/or Complexity of Data Reviewed Labs: ordered. Radiology: ordered.  Risk Prescription drug management.   This patient presents to the ED for concern of lower abdominal pain, this involves an extensive number of treatment options, and is a complaint that carries with it a high risk of complications and morbidity.  The differential diagnosis includes not limited to ovarian cyst, PID, problem with IUD, UTI   Co morbidities that complicate the patient evaluation  Former smoker   Additional history obtained:  External records from outside source obtained and reviewed including labs from  03/2018- negative for gonorrhea/chlamydia    Lab Tests:  I Ordered, and personally interpreted labs.  The pertinent results include: CBC unremarkable including normal WBC.  hCG negative.  Lipase normal.  CMP unremarkable including normal LFTs.  Urinalysis with large leukocytes, 21-50 white cells however no bacteria, no urinary symptoms.  Wet prep is positive for clue cells.  Send out gonorrhea and chlamydia test pending at time of dispo.   Imaging Studies ordered:  I ordered imaging studies including pelvic ultrasound As read by radiology, concern for IUD in the endometrium.   Consultations Obtained:  I requested consultation with the Dr. March Rummage with GYN,  and discussed lab and imaging findings as well as pertinent plan - they recommend: patient can follow up with her GYN for IUD removal.   Problem List / ED Course / Critical interventions / Medication management  26 year old female with complaint of pelvic pain with concern for source being her IUD.  She is evaluated and found to have lower abdominal pain, rolling in the bed.  Labs are reassuring.  Ultrasound with concern for IUD within  the musculature.  Discussed with gynecology on-call who advises patient can follow-up with her gynecologist outpatient for this.  She is afebrile with a normal white count, does not have significant pelvic tenderness on exam.  Her gonorrhea and Chlamydia tests are pending.  She is found to have BV, possibly causing the discharge she is concerned about however given her degree of pain and presentation the ER, will cover with Rocephin and Doxy pending test results and follow-up. I ordered medication including Toradol, Rocephin for concern for infection and pain Reevaluation of the patient after these medicines showed that the patient improved I have reviewed the patients home medicines and have made adjustments as needed   Social Determinants of Health:  Has PCP and gynecology for follow-up   Test / Admission - Considered:  Consider treatment for UTI however urinalysis does not have any bacteria in it and patient does not have urinary symptoms.  Will cover with Rocephin and Doxy for pelvic infections given her pain pending further evaluation with her gynecologist regarding her IUD         Final Clinical Impression(s) / ED Diagnoses Final diagnoses:  Pelvic pain  IUD (intrauterine device) in place  Bacterial vaginosis    Rx / DC Orders ED Discharge Orders          Ordered    doxycycline (VIBRAMYCIN) 100 MG capsule  2 times daily        02/11/22 0821    ketorolac (TORADOL) 10 MG tablet  Every 6 hours PRN        02/11/22 0821    metroNIDAZOLE (FLAGYL) 500 MG tablet  2 times daily        02/11/22 0834              Jeannie Fend, PA-C 02/11/22 1456    Long, Arlyss Repress, MD 02/13/22 1616

## 2022-02-11 NOTE — Consult Note (Signed)
   OB/GYN Telephone Consult  02/11/22  April Barber is a 26 y.o. G1P0101 presenting to ED due to pelvic pain. I was called for a consult regarding the care of this patient by [facility].    The provider had a clinical question about IUD location/follow-up  The provider presented the following relevant clinical information: Pt presented with pelvic pain.  No evidence of infection or acute abdomen.  On [place], concern that IUD may be within muscle.   Provider noted that they could see IUD strings on pelvic exam  I performed a chart review on the patient and reviewed available documentation.  BP 136/79   Pulse 63   Temp 98.4 F (36.9 C) (Oral)   Resp 16   Ht 5\' 7"  (1.702 m)   Wt 99.8 kg   SpO2 100%   BMI 34.46 kg/m   Exam- performed by consulting provider   Recommendations:  -no immediate intervention regarding the IUD needed -if strings visible, should be able to remove device in office during outpatient follow up -pt to follow up as scheduled for IUD removal  Thank you for this consult and if additional recommendations are needed please call [phone] for the OB/GYN attending on service at [facility].   I spent approximately 5 minutes directly consulting with the provider and verbally discussing this case. Additionally 5 minutes minutes was spent performing chart review and documentation.   [facility], DO Attending Obstetrician & Gynecologist, [facility] for [facility]

## 2022-02-11 NOTE — ED Notes (Signed)
Discharge papers have been discussed with patient. Patient is stable and ambulatory and VSS.

## 2022-02-12 LAB — GC/CHLAMYDIA PROBE AMP (~~LOC~~) NOT AT ARMC
Chlamydia: NEGATIVE
Comment: NEGATIVE
Comment: NORMAL
Neisseria Gonorrhea: POSITIVE — AB

## 2022-04-06 ENCOUNTER — Encounter (HOSPITAL_COMMUNITY): Payer: Self-pay | Admitting: Emergency Medicine

## 2022-04-06 ENCOUNTER — Emergency Department (HOSPITAL_COMMUNITY)
Admission: EM | Admit: 2022-04-06 | Discharge: 2022-04-06 | Disposition: A | Discharge status: Home / Self Care | Payer: Medicaid Other | Attending: Emergency Medicine | Admitting: Emergency Medicine

## 2022-04-06 ENCOUNTER — Other Ambulatory Visit: Payer: Self-pay

## 2022-04-06 ENCOUNTER — Emergency Department (HOSPITAL_COMMUNITY): Payer: Medicaid Other

## 2022-04-06 DIAGNOSIS — S8011XA Contusion of right lower leg, initial encounter: Secondary | ICD-10-CM | POA: Insufficient documentation

## 2022-04-06 DIAGNOSIS — S40022A Contusion of left upper arm, initial encounter: Secondary | ICD-10-CM | POA: Insufficient documentation

## 2022-04-06 DIAGNOSIS — T07XXXA Unspecified multiple injuries, initial encounter: Secondary | ICD-10-CM

## 2022-04-06 DIAGNOSIS — S40021A Contusion of right upper arm, initial encounter: Secondary | ICD-10-CM | POA: Diagnosis not present

## 2022-04-06 DIAGNOSIS — S9032XA Contusion of left foot, initial encounter: Secondary | ICD-10-CM | POA: Insufficient documentation

## 2022-04-06 DIAGNOSIS — S99922A Unspecified injury of left foot, initial encounter: Secondary | ICD-10-CM | POA: Diagnosis present

## 2022-04-06 DIAGNOSIS — W01198A Fall on same level from slipping, tripping and stumbling with subsequent striking against other object, initial encounter: Secondary | ICD-10-CM | POA: Insufficient documentation

## 2022-04-06 LAB — I-STAT BETA HCG BLOOD, ED (MC, WL, AP ONLY): I-stat hCG, quantitative: <5 m[IU]/mL (ref <5)

## 2022-04-06 MED ORDER — DICLOFENAC SODIUM 75 MG PO TBEC: 75.0000 mg | DELAYED_RELEASE_TABLET | Freq: Two times a day (BID) | ORAL | 0 refills | Status: DC
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
## 2022-04-06 NOTE — Discharge Instructions (Addendum)
Return if any problems.

## 2022-04-06 NOTE — ED Notes (Signed)
Ortho at bedside.

## 2022-04-06 NOTE — Progress Notes (Signed)
Orthopedic Tech Progress Note Patient Details:  Sharin Altidor [birth year] [id number]  Ortho Devices Type of Ortho Device: Postop shoe/boot, Ace wrap, Crutches Ortho Device/Splint Location: Left ankle Ortho Device/Splint Interventions: Application   Post Interventions Patient Tolerated: Well  Linus Salmons Aphrodite Harpenau 04/06/2022, 4:46 PM

## 2022-04-22 ENCOUNTER — Telehealth: Payer: Self-pay

## 2022-04-23 ENCOUNTER — Telehealth: Payer: Self-pay

## 2022-04-24 ENCOUNTER — Ambulatory Visit: Payer: Medicaid Other | Admitting: Obstetrics and Gynecology

## 2023-04-03 ENCOUNTER — Encounter (HOSPITAL_COMMUNITY): Payer: Self-pay

## 2023-04-03 ENCOUNTER — Ambulatory Visit (HOSPITAL_COMMUNITY)
Admission: EM | Admit: 2023-04-03 | Discharge: 2023-04-03 | Disposition: A | Discharge status: Home / Self Care | Payer: Medicaid Other | Attending: Emergency Medicine | Admitting: Emergency Medicine

## 2023-04-03 DIAGNOSIS — K0889 Other specified disorders of teeth and supporting structures: Secondary | ICD-10-CM | POA: Diagnosis not present

## 2023-04-03 DIAGNOSIS — K047 Periapical abscess without sinus: Secondary | ICD-10-CM | POA: Diagnosis not present

## 2023-04-03 MED ORDER — IBUPROFEN 800 MG PO TABS: 800.0000 mg | ORAL_TABLET | Freq: Three times a day (TID) | ORAL | 0 refills | Status: DC | PRN

## 2023-04-03 MED ORDER — AMOXICILLIN-POT CLAVULANATE 875-125 MG PO TABS: 1.0000 | ORAL_TABLET | Freq: Two times a day (BID) | ORAL | 0 refills | Status: DC

## 2023-04-03 NOTE — Discharge Instructions (Signed)
Start taking Augmentin twice daily for for 7 days for dental infection. You can alternate between Ibuprofen and Tylenol every 4-6 hours as needed for pain. I have attached a list of dental resources to follow-up with for further evaluation and routine visits. Return here as needed.

## 2023-04-03 NOTE — ED Provider Notes (Signed)
[facility]    CSN: [id number] Arrival date & time: 04/03/23  0803      History   Chief Complaint Chief Complaint  Patient presents with   Dental Pain    HPI April Barber is a 27 y.o. female.   Patient presents with left lower dental pain x 5 days. Patient states she has been trying to get in with a dentist that takes Medicaid but has been unsuccessful.  Denies taking anything for pain.   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 Authorizing Provider  amoxicillin-clavulanate (AUGMENTIN) 875-125 MG tablet Take 1 tablet by mouth every 12 (twelve) hours. 04/03/23  Yes Susann Givens, Elisah Parmer A, NP  ibuprofen (ADVIL) 800 MG tablet Take 1 tablet (800 mg total) by mouth 3 (three) times daily as needed for mild pain or moderate pain. 04/03/23  Yes Letta Kocher, NP    Family History Family History  Problem Relation Age of Onset   Hypertension Mother    Diabetes Maternal Uncle     Social History Social History   Tobacco Use   Smoking status: Former    Types: Cigars   Smokeless tobacco: Never   Tobacco comments:    2 cigars per day   Vaping Use   Vaping status: Never Used  Substance Use Topics   Alcohol use: Yes    Comment: occ   Drug use: No     Allergies   Patient has no known allergies.   Review of  Systems Review of Systems  Constitutional:  Negative for chills, fatigue and fever.  HENT:  Positive for dental problem. Negative for ear pain and sore throat.   Eyes:  Negative for pain.  Gastrointestinal:  Negative for nausea and vomiting.  Neurological:  Negative for dizziness, light-headedness and headaches.     Physical Exam Triage Vital Signs ED Triage Vitals [04/03/23 0830]  Encounter Vitals Group     BP 123/75     Systolic BP Percentile      Diastolic BP Percentile      Pulse Rate (!) 56     Resp 16     Temp 98.2 F (36.8 C)     Temp Source Oral     SpO2 98 %     Weight      Height      Head Circumference      Peak Flow      Pain Score      Pain Loc      Pain Education  Exclude from Growth Chart    No data found.  Updated Vital Signs BP 123/75 (BP Location: Left Arm)   Pulse (!) 56   Temp 98.2 F (36.8 C) (Oral)   Resp 16   SpO2 98%   Visual Acuity Right Eye Distance:   Left Eye Distance:   Bilateral Distance:    Right Eye Near:   Left Eye Near:    Bilateral Near:     Physical Exam Vitals and nursing note reviewed.  Constitutional:      General: She is awake. She is not in acute distress.    Appearance: Normal appearance. She is well-developed and well-groomed. She is not ill-appearing, toxic-appearing or diaphoretic.  HENT:     Mouth/Throat:     Dentition: Abnormal dentition. Dental tenderness, gingival swelling and dental caries present. No dental abscesses or gum lesions.     Tongue: No lesions. Tongue does not deviate from midline.     Palate: No mass and lesions.     Comments: Mild tenderness and gingival swelling to left lower mouth with multiple dental caries throughout mouth. Neurological:     Mental Status: She is alert.  Psychiatric:        Behavior: Behavior is cooperative.      UC Treatments / Results  Labs (all labs ordered are listed, but only abnormal results are displayed) Labs Reviewed - No data to  display  EKG   Radiology No results found.  Procedures Procedures (including critical care time)  Medications Ordered in UC Medications - No data to display  Initial Impression / Assessment and Plan / UC Course  I have reviewed the triage vital signs and the nursing notes.  Pertinent labs & imaging results that were available during my care of the patient were reviewed by me and considered in my medical decision making (see chart for details).     Patient presented with 5-day history of left lower dental pain.  Denies taking anything for pain.  Upon assessment she has mild tenderness and gingival swelling to the left lower mouth with multiple dental caries throughout mouth.  No obvious dental abscess present at this time.  Prescribed Augmentin for dental infection.  Recommended ibuprofen and Tylenol as needed for pain.  Given dental resources to follow-up with.  Discussed return precautions. Final Clinical Impressions(s) / UC Diagnoses   Final diagnoses:  Dental infection  Pain, dental     Discharge Instructions      Start taking Augmentin twice daily for for 7 days for dental infection. You can alternate between Ibuprofen and Tylenol every 4-6 hours as needed for pain. I have attached a list of dental resources to follow-up with for further evaluation and routine visits. Return here as needed.     ED Prescriptions     Medication Sig Dispense Auth. Provider   amoxicillin-clavulanate (AUGMENTIN) 875-125 MG tablet Take 1 tablet by mouth every 12 (twelve) hours. 14 tablet Susann Givens, Adaisha Campise A, NP   ibuprofen (ADVIL) 800 MG tablet Take 1 tablet (800 mg total) by mouth 3 (three) times daily as needed for mild pain or moderate pain. 21 tablet Wynonia Lawman A, NP      PDMP not reviewed this encounter.   Wynonia Lawman A, NP 04/03/23 [phone]

## 2023-04-03 NOTE — ED Triage Notes (Signed)
Pt is here for dental pain. Pt does not have a dentist.

## 2023-04-16 ENCOUNTER — Ambulatory Visit: Payer: Medicaid Other

## 2023-06-05 ENCOUNTER — Other Ambulatory Visit (HOSPITAL_COMMUNITY)
Admission: RE | Admit: 2023-06-05 | Discharge: 2023-06-05 | Disposition: A | Discharge status: Home / Self Care | Payer: Medicaid Other | Source: Ambulatory Visit

## 2023-06-05 ENCOUNTER — Ambulatory Visit: Payer: Medicaid Other

## 2023-06-05 VITALS — BP 119/74 | HR 75 | Ht 67.0 in | Wt 220.6 lb

## 2023-06-05 DIAGNOSIS — Z1151 Encounter for screening for human papillomavirus (HPV): Secondary | ICD-10-CM

## 2023-06-05 DIAGNOSIS — F419 Anxiety disorder, unspecified: Secondary | ICD-10-CM

## 2023-06-05 DIAGNOSIS — Z30432 Encounter for removal of intrauterine contraceptive device: Secondary | ICD-10-CM

## 2023-06-05 DIAGNOSIS — N898 Other specified noninflammatory disorders of vagina: Secondary | ICD-10-CM

## 2023-06-05 DIAGNOSIS — Z01419 Encounter for gynecological examination (general) (routine) without abnormal findings: Secondary | ICD-10-CM | POA: Insufficient documentation

## 2023-06-05 DIAGNOSIS — Z124 Encounter for screening for malignant neoplasm of cervix: Secondary | ICD-10-CM | POA: Diagnosis present

## 2023-06-05 DIAGNOSIS — Z113 Encounter for screening for infections with a predominantly sexual mode of transmission: Secondary | ICD-10-CM | POA: Insufficient documentation

## 2023-06-05 DIAGNOSIS — F32A Depression, unspecified: Secondary | ICD-10-CM

## 2023-06-05 DIAGNOSIS — Z1239 Encounter for other screening for malignant neoplasm of breast: Secondary | ICD-10-CM

## 2023-06-05 DIAGNOSIS — F17200 Nicotine dependence, unspecified, uncomplicated: Secondary | ICD-10-CM

## 2023-06-05 NOTE — Progress Notes (Signed)
GYNECOLOGY OFFICE VISIT NOTE-WELL WOMAN EXAM  History:   April Barber is a 27 year old here today for well woman exam. She states she hasn't had an pap smear since having her daughter in 2018. She also reports having a Mirena IUD in place that causes occasional cycle and stomach pain.  She endorses some feelings of depression and anxiety and requests referral to behavioral health.    Birth Control:  IUD desires removal. Declines alternative method and is okay if pregnancy occurs.   Reproductive Concerns Sexually Active: Yes Partners Type: Female Number of partners in last year: One STD Testing: Desires Full  Obstetrical History: G1P0101 VD in 2018 Gynecological History: Denies Vaginal/GU Concerns: Reports odor after sexual activity. No issues with urination, constipation, or diarrhea.  Breast Concerns/Exams: No issues. Reports checking daily.  Endorses SBA. Denies family history of breast, uterine, cervical, or ovarian cancer  Medical and Nutrition PCP: Family Medicine. States she hasn't been seen in the past 3 years.  Significant PMx: None Exercise: None Tobacco/Drugs/Alcohol: Reports smoking cigars 2-3x/day. Desires to quit. Nutrition: Denies balanced intake.  However, she does cook at home.   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   Health Maintenance: Pap: Collected today. Previous Jan 2018, Negative Results.   Mammogram: N/A. Colonoscopy: N/A Review of Systems:  Pertinent items noted in HPI and remainder of  comprehensive ROS otherwise negative.    Objective:    Physical Exam BP 119/74   Pulse 75   Ht 5\' 7"  (1.702 m)   Wt 220 lb 9.6 oz (100.1 kg)   BMI 34.55 kg/m  Physical Exam Vitals and nursing note reviewed. Exam conducted with a chaperone present Morrie Sheldon, RN).  Constitutional:      General: She is not in acute distress.    Appearance: Normal appearance.  HENT:     Head: Normocephalic and atraumatic.  Eyes:     Conjunctiva/sclera: Conjunctivae normal.  Cardiovascular:     Rate and Rhythm: Normal rate.  Pulmonary:     Effort: Pulmonary effort is normal. No respiratory distress.     Breath sounds: Normal breath sounds.  Chest:  Breasts:    Right: No mass, nipple discharge, skin change or tenderness.     Left: No mass, nipple discharge, skin change or tenderness.     Comments: CBE completed and normal Abdominal:     General: Bowel sounds are normal.     Palpations: Abdomen is soft.     Tenderness: There is no abdominal tenderness.  Genitourinary:    Comments: NEFG Pink mucosa with small amt white discharge.  IUD strings noted from cervix. Pap collected with broom and spatula.   Musculoskeletal:        General: Normal range of motion.     Cervical back: Normal range of motion.  Skin:    General: Skin is warm and dry.     Comments: Multiple Tattoos noted  Neurological:     Mental Status: She is alert and oriented to person, place, and time.  Psychiatric:  Mood and Affect: Mood normal.        Behavior: Behavior normal.      Labs and Imaging No results found for this or any previous visit (from the past 168 hour(s)). No results found.   Assessment & Plan:  27 year old Female Well Woman Exam with Pap & Breast Exam STD Testing Current Smoker Anxiety/Depression IUD Removal  1. Well woman exam with routine gynecological exam -Exam performed and findings discussed. -Encouraged to activate and utilize Mychart for reviewing of results, communication with  office, and scheduling of appts. -Educated on AHA exercise recommendations of 30 minutes of moderate to vigorous activity at least 5x/week.  2. Pap smear for cervical cancer screening -Educated on ASCCP guidelines regarding pap smear evaluation and frequency. -Informed of turnover time and provider/clinic policy on releasing results.  3. Screening examination for STD (sexually transmitted disease) -Orders placed.  -Plan to treat as appropriate.   4. Vaginal odor -Discussed that likely from BV. -Will treat accordingly. -Rx for metronidazole sent to pharmacy on file.   5. Encounter for screening breast examination -CBE completed and normal. -Educated and encouraged to continue SBE with increased breast awareness including examination of breast for skin changes, moles, tenderness, etc.   6. Smoker -Desires to quit. -Information for quit line placed in AVS.   7. Anxiety and depression -GAD: 7 -PHQ: 6 -Informed that will provide information, in AVS, for behavioral health service options in the community.   8. Encounter for IUD removal -See separate note -Rx for PNV sent to pharmacy on file.    Routine preventative health maintenance measures emphasized. Please refer to After Visit Summary for other counseling recommendations.   No follow-ups on file.      Cherre Robins, CNM 06/05/2023

## 2023-06-05 NOTE — Progress Notes (Signed)
Pt presents for AEX. Last PAP 07/2016 Requesting STD testing. Requesting behavioral health consulting

## 2023-06-05 NOTE — Patient Instructions (Addendum)
Behavioral Health Resources:   What if I or someone I know is in crisis?  If you are thinking about harming yourself or having thoughts of suicide, or if you know someone who is, seek help right away.  Call your doctor or mental health care provider.  Call 911 or go to a hospital emergency room to get immediate help, or ask a friend or family member to help you do these things; IF YOU ARE IN [place], YOU MAY GO TO WALK-IN URGENT CARE 24/7 at [facility] (see below)  Call the [place] National Suicide Prevention Lifeline's toll-free, 24-hour hotline at 1-800-273-TALK 604-705-9959) or TTY: 1-800-799-4 TTY 651-508-5534) to talk to a trained counselor.  If you are in crisis, make sure you are not left alone.   If someone else is in crisis, make sure he or she is not left alone   24 Hour :   [facility]  [address], [place], [place] [address] [phone] or [phone] WALK-IN URGENT CARE 24/7  [facility] Mobile Crisis: 918-521-0903  [place] National Suicide Hotline: 971-712-5665  [facility] the [employer] (Domestic Violence, Rape & Victim Assistance)  579-789-1751  [facility]  [address], [place]  [address]   [phone] or ([phone]   [facility] [employer] [facility]: [phone] (8am-4pm) or [phone] (after hours)        [facility], [address], [place], [place]  [phone] Fax: [phone] [email or web address] *Interpreters available *Accepts all insurance and uninsured for Urgent Care needs *Accepts Medicaid and uninsured for outpatient treatment   [facility]   Mon-Fri: 8am-5pm [address], [place], [place] [phone](FYBOF); [phone](fax) [email or web address]  *Accepts Medicare  [facility] [place], Fri: 8am-4pm [address], [place], [place] [address] [phone] (phone); [phone] (fax) ExShows.dk  *Accepts Medicare  [facility] Mon-Fri: 9am-5pm  [address] [place], [place] [phone] (phone); [phone]  MommyCollege.dk  *Accepts Medicaid  [facility], 8:30am-12pm/1pm-2:30pm [address] [place], [place] [phone] (phone); [phone] (fax) www.[email or web address]  *Accepts Medicaid, sliding-scale*Bilingual services available  [facility] Mon-Fri, 8am-7pm [address], [place], [place]  [phone](STMHD); [phone](fax) www.[email or web address]  *Accepts Medicaid *Bilingual services available  [facility] Mon-Fri: 8am-5pm, Saturday by appointment only [address] [place], [place], [place] [phone] (phone); [phone] (fax) [email or web address]   [facility] [address], [place], [place] [phone] [email or web address]  *Free & reduced services for uninsured and underinsured individuals *Bilingual services for Spanish-speaking clients 21 and under  [facility], [address] [place], [place] [phone](NOMVE); [phone](fax) KittenExchange.[email or web address]  *Bring your own interpreter at first visit *Accepts Medicare and [facility]  [facility] Mon-Fri: 9am-5:30pm [address], [place], [place] [phone] (phone), [phone] (fax) After hours crisis line: [phone] [email or web address].[email or web address]  *Accepts Medicare and Medicaid  [employer], 8am-6pm [address], [place], [place]  [phone] (phone); [phone] (fax) http://[email or web address]  *Subsidized costs available  [facility] Mon-Fri:  8am-4pm [address] [place], [place] [phone](QZESP); [phone](fax) www.p[email or web address]  *Accepts Medicaid  [facility] Same day access hours: Mon-Fri, 8:30-3pm Crisis hours: Mon-Fri, 8am-5pm [address] [place], [place] [phone]  [facility] Same day access hours: Mon-Fri, 8:30-3pm Crisis hours: Mon-Fri, 8am-8pm [address], [place], [place] [phone] (phone); [phone] (fax) www.[email or web address]  *Accepts Medicaid and Medicare  [facility] [place], [place], Fri: 9am-9pm Tues, Thurs: 9am-6pm [address] [place], [place], [place]  [phone] (phone); [phone] (fax) [email or web address]://[email or web address]  *(Accepts Medicare and Medicaid; payment plans available)*Bilingual services available  [facility] [address], [place], [place] [phone] (phone); [phone] (fax) www.[email or web address]   [facility] [address], [place], [place]  [phone]  RackRewards.fr  *Bilingual services available  [facility]) Mon-Thurs: 8am-8pm [address], [place], [place] [phone] (phone); [phone] (fax) ScrapbookLive.si  *Accepts Medicaid*Bilingual services available  [facility] [address] [place], [place] [phone] (phone) BrotherBig.[email or web address]  *Accepts Medicaid *Bilingual services available  [facility] Mon-Fri, 9am-4:45pm [address], [place], [place] [phone] (phone); [phone] (fax) [email or web address]://[email or web address]  *Accepts Medicare  [facility] Mon-Thurs: 8:30-8pm, Fri: 8:30am-7pm [address] [place], [place] (3rd floor) [phone] (phone); [phone] (fax) [email or web address]  *Accepts Medicaid; income-based reduced rates available  [facility] Mon-Fri: 8am-5pm [address], [place], [place] [address] [phone] (phone); [phone] (fax) [email or web address]://[email or web address]  *Accepts Medicaid*Bilingual services available   [facility] [place])  [address], [place] [phone] www.[email or web address]  *Provides direct services to individuals in recovery from mental illness, including support groups, recovery skills classes, and one on one peer support  [employer] on Mental Illness) [place] helpline: 231-128-8363  [facility] [place] helpline: (775)732-9231 https://[email or web address]  *A community hub for information relating to local resources and services for the friends and families of individuals living alongside a mental health condition, as well as the individuals themselves. Classes and support groups also provided    QuitlineNC QuitlineNC provides free cessation services to any [place] resident who needs help quitting commercial tobacco use, which includes all tobacco products offered for sale, not tobacco used for sacred and traditional ceremonies by many [place] tribes and communities. Quit Coaching is available in different forms, which can be used separately or together, to help any tobacco user give up tobacco.  Call Quitline  Call QuitlineNC Get free tobacco cessation help 24/7 in several ways:  1-800-QUIT-NOW 279-621-8683); Espaol: 1-855-Djelo-Ya (2-353-614-4315) o para ms informacin haga clic aqu ; Interpretation services available for many languages; Register online ( en espaol ) TTY: 2122073296 [place] Quitline: Call 888-7AI-QUIT (270)821-4194) Donia Ast Icon  Enroll Online Once you register, you will have an online dashboard to help you track your quitting program. You will be able to interact with your quit coach using text, online chat or telephone.  Register now ( en espaol ).  Or Text "Ready" to ([phone])328-3894  Fax QuitlineNC Icon  Become a QuitlineNC  Referral Site Registration Form E-Referral Guide QuitlineNC E-Referral Portal     Live Vape Free Live Vape Free is a quit vaping program for [place] young people aged 13-26.  To get started, text VAPEFREENC to 415-235-9276  Are you an adult concerned about your teen? Visit Live Vape Free  [facility]  QuitlineNC gets compliments! People who have quit tobacco use with QuitlineNC have lots to say about our services and our people -- praise for QuitlineNC .  QuitlineNC gets compliments. What You Need To Know About Quitting Smoking: Advice from the Surgeon General Provides an easy to read overview of the Surgeon General's report and its findings. Also provides an overview of smoking cessation resources.  Smoking Cessation: A Report of the Surgeon General

## 2023-06-06 LAB — RPR: RPR Ser Ql: NONREACTIVE

## 2023-06-06 LAB — CERVICOVAGINAL ANCILLARY ONLY
Chlamydia: NEGATIVE
Comment: NEGATIVE
Comment: NEGATIVE
Comment: NORMAL
Neisseria Gonorrhea: NEGATIVE
Trichomonas: NEGATIVE

## 2023-06-06 LAB — CYTOLOGY - PAP: Diagnosis: NEGATIVE

## 2023-06-06 LAB — HIV ANTIBODY (ROUTINE TESTING W REFLEX): HIV Screen 4th Generation wRfx: NONREACTIVE

## 2023-06-06 LAB — HEPATITIS B SURFACE ANTIGEN: Hepatitis B Surface Ag: NEGATIVE

## 2023-06-06 LAB — HEPATITIS C ANTIBODY: Hep C Virus Ab: NONREACTIVE

## 2023-06-07 MED ORDER — METRONIDAZOLE 500 MG PO TABS: 500.0000 mg | ORAL_TABLET | Freq: Two times a day (BID) | ORAL | 0 refills | Status: AC

## 2023-06-07 MED ORDER — PREPLUS 27-1 MG PO TABS: 1.0000 | ORAL_TABLET | Freq: Every day | ORAL | 4 refills | Status: AC

## 2023-06-07 NOTE — Progress Notes (Signed)
    GYNECOLOGY OFFICE PROCEDURE NOTE  April Barber is a 27 y.o. G1P0101 here for Mirena IUD removal. No GYN concerns.  Pap smear was completed today .  IUD Removal  Patient identified, informed consent performed, consent signed.  Patient was in the dorsal lithotomy position, normal external genitalia was noted.  A speculum was placed in the patient's vagina, normal discharge was noted, no lesions. The cervix was visualized, no lesions, no abnormal discharge. Pap collected.  The strings of the IUD were grasped and pulled using ring forceps. The IUD was removed in its entirety. Patient tolerated the procedure well.    Patient plans for pregnancy soon and she was told to avoid teratogens, take PNV and folic acid.  Routine preventative health maintenance measures emphasized.   Cherre Robins MSN, CNM Advanced Practice Provider, Center for [employer]

## 2023-08-25 ENCOUNTER — Encounter (HOSPITAL_COMMUNITY): Payer: Self-pay

## 2023-08-25 ENCOUNTER — Other Ambulatory Visit: Payer: Self-pay

## 2023-08-25 ENCOUNTER — Emergency Department (HOSPITAL_COMMUNITY)
Admission: EM | Admit: 2023-08-25 | Discharge: 2023-08-25 | Disposition: A | Discharge status: Home / Self Care | Payer: Medicaid Other | Attending: Emergency Medicine | Admitting: Emergency Medicine

## 2023-08-25 DIAGNOSIS — H20042 Secondary noninfectious iridocyclitis, left eye: Secondary | ICD-10-CM | POA: Diagnosis not present

## 2023-08-25 DIAGNOSIS — H5712 Ocular pain, left eye: Secondary | ICD-10-CM | POA: Diagnosis present

## 2023-08-25 DIAGNOSIS — H209 Unspecified iridocyclitis: Secondary | ICD-10-CM

## 2023-08-25 MED ORDER — IBUPROFEN 200 MG PO TABS
600.0000 mg | ORAL_TABLET | Freq: Once | ORAL | Status: AC
  Administered 2023-08-25: 600 mg via ORAL
  Filled 2023-08-25: qty 1

## 2023-08-25 MED ORDER — CYCLOPENTOLATE HCL 2 % OP SOLN: 1.0000 [drp] | Freq: Three times a day (TID) | OPHTHALMIC | 0 refills | Status: AC

## 2023-08-25 MED ORDER — PREDNISOLONE ACETATE 0.12 % OP SUSP: 1.0000 [drp] | Freq: Four times a day (QID) | OPHTHALMIC | 0 refills | Status: AC

## 2023-08-25 MED ORDER — FLUORESCEIN SODIUM 1 MG OP STRP
1.0000 | ORAL_STRIP | Freq: Once | OPHTHALMIC | Status: AC
  Administered 2023-08-25: 1 via OPHTHALMIC
  Filled 2023-08-25: qty 1

## 2023-08-25 MED ORDER — TETRACAINE HCL 0.5 % OP SOLN
1.0000 [drp] | Freq: Once | OPHTHALMIC | Status: AC
  Administered 2023-08-25: 2 [drp] via OPHTHALMIC
  Filled 2023-08-25: qty 4

## 2023-08-25 NOTE — ED Notes (Signed)
 Pt discharged by Asher Muir, RN.

## 2023-08-25 NOTE — Discharge Instructions (Addendum)
 [facility]:  Thank you for allowing [place] to take care of you today.  We hope you begin feeling better soon.  To-Do: Please follow-up with ophthalmology.  Please call their office directly at [phone]  to schedule an appointment. Please return to the Emergency Department or call 911 if you experience chest pain, shortness of breath, severe pain, severe fever, altered mental status, or have any reason to think that you need emergency medical care.  Thank you again.  Hope you feel better soon.  Department of Emergency Medicine [facility]
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
## 2023-10-13 ENCOUNTER — Encounter (HOSPITAL_COMMUNITY): Payer: Self-pay | Admitting: Emergency Medicine

## 2023-10-13 ENCOUNTER — Emergency Department (HOSPITAL_COMMUNITY)

## 2023-10-13 ENCOUNTER — Emergency Department (HOSPITAL_COMMUNITY)
Admission: EM | Admit: 2023-10-13 | Discharge: 2023-10-13 | Disposition: A | Discharge status: Home / Self Care | Attending: Emergency Medicine | Admitting: Emergency Medicine

## 2023-10-13 DIAGNOSIS — Y906 Blood alcohol level of 120-199 mg/100 ml: Secondary | ICD-10-CM | POA: Insufficient documentation

## 2023-10-13 DIAGNOSIS — F1010 Alcohol abuse, uncomplicated: Secondary | ICD-10-CM | POA: Diagnosis not present

## 2023-10-13 DIAGNOSIS — S0990XA Unspecified injury of head, initial encounter: Secondary | ICD-10-CM | POA: Insufficient documentation

## 2023-10-13 DIAGNOSIS — Y92410 Unspecified street and highway as the place of occurrence of the external cause: Secondary | ICD-10-CM | POA: Insufficient documentation

## 2023-10-13 DIAGNOSIS — S0081XA Abrasion of other part of head, initial encounter: Secondary | ICD-10-CM | POA: Insufficient documentation

## 2023-10-13 DIAGNOSIS — Z23 Encounter for immunization: Secondary | ICD-10-CM | POA: Insufficient documentation

## 2023-10-13 DIAGNOSIS — F17290 Nicotine dependence, other tobacco product, uncomplicated: Secondary | ICD-10-CM | POA: Diagnosis not present

## 2023-10-13 DIAGNOSIS — S73004A Unspecified dislocation of right hip, initial encounter: Secondary | ICD-10-CM | POA: Diagnosis not present

## 2023-10-13 LAB — COMPREHENSIVE METABOLIC PANEL WITH GFR
ALT: 23 U/L (ref 0–44)
AST: 27 U/L (ref 15–41)
Albumin: 3.7 g/dL (ref 3.5–5.0)
Alkaline Phosphatase: 55 U/L (ref 38–126)
Anion gap: 15 (ref 5–15)
BUN: 11 mg/dL (ref 6–20)
CO2: 19 mmol/L — ABNORMAL LOW (ref 22–32)
Calcium: 8.4 mg/dL — ABNORMAL LOW (ref 8.9–10.3)
Chloride: 106 mmol/L (ref 98–111)
Creatinine, Ser: 0.98 mg/dL (ref 0.44–1.00)
GFR, Estimated: >60 mL/min (ref >60)
Glucose, Bld: 147 mg/dL — ABNORMAL HIGH (ref 70–99)
Potassium: 3.3 mmol/L — ABNORMAL LOW (ref 3.5–5.1)
Sodium: 140 mmol/L (ref 135–145)
Total Bilirubin: 0.4 mg/dL (ref 0.0–1.2)
Total Protein: 7.2 g/dL (ref 6.5–8.1)

## 2023-10-13 LAB — I-STAT CHEM 8, ED
BUN: 11 mg/dL (ref 6–20)
Calcium, Ion: 0.92 mmol/L — ABNORMAL LOW (ref 1.15–1.40)
Chloride: 107 mmol/L (ref 98–111)
Creatinine, Ser: 1.2 mg/dL — ABNORMAL HIGH (ref 0.44–1.00)
Glucose, Bld: 143 mg/dL — ABNORMAL HIGH (ref 70–99)
HCT: 45 % (ref 36.0–46.0)
Hemoglobin: 15.3 g/dL — ABNORMAL HIGH (ref 12.0–15.0)
Potassium: 3.2 mmol/L — ABNORMAL LOW (ref 3.5–5.1)
Sodium: 142 mmol/L (ref 135–145)
TCO2: 20 mmol/L — ABNORMAL LOW (ref 22–32)

## 2023-10-13 LAB — CBC
HCT: 44.1 % (ref 36.0–46.0)
Hemoglobin: 14.4 g/dL (ref 12.0–15.0)
MCH: 28.2 pg (ref 26.0–34.0)
MCHC: 32.7 g/dL (ref 30.0–36.0)
MCV: 86.3 fL (ref 80.0–100.0)
Platelets: 328 10*3/uL (ref 150–400)
RBC: 5.11 MIL/uL (ref 3.87–5.11)
RDW: 13.8 % (ref 11.5–15.5)
WBC: 7 10*3/uL (ref 4.0–10.5)
nRBC: 0 % (ref 0.0–0.2)

## 2023-10-13 LAB — PROTIME-INR
INR: 1 (ref 0.8–1.2)
Prothrombin Time: 13.3 s (ref 11.4–15.2)

## 2023-10-13 LAB — ETHANOL: Alcohol, Ethyl (B): 198 mg/dL — ABNORMAL HIGH (ref <10)

## 2023-10-13 LAB — I-STAT CG4 LACTIC ACID, ED: Lactic Acid, Venous: 4.4 mmol/L (ref 0.5–1.9)

## 2023-10-13 LAB — SAMPLE TO BLOOD BANK

## 2023-10-13 MED ORDER — TETANUS-DIPHTH-ACELL PERTUSSIS 5-2.5-18.5 LF-MCG/0.5 IM SUSY
0.5000 mL | PREFILLED_SYRINGE | Freq: Once | INTRAMUSCULAR | Status: AC
  Administered 2023-10-13: 0.5 mL via INTRAMUSCULAR
  Filled 2023-10-13: qty 0.5

## 2023-10-13 MED ORDER — CYCLOBENZAPRINE HCL 10 MG PO TABS: 10.0000 mg | ORAL_TABLET | Freq: Two times a day (BID) | ORAL | 0 refills | Status: AC | PRN

## 2023-10-13 MED ORDER — CEFAZOLIN SODIUM-DEXTROSE 2-4 GM/100ML-% IV SOLN
2.0000 g | Freq: Once | INTRAVENOUS | Status: AC
  Administered 2023-10-13: 2 g via INTRAVENOUS
  Filled 2023-10-13: qty 100

## 2023-10-13 MED ORDER — KETAMINE HCL 50 MG/5ML IJ SOSY
0.5000 mg/kg | PREFILLED_SYRINGE | Freq: Once | INTRAMUSCULAR | Status: AC
  Administered 2023-10-13: 50 mg via INTRAVENOUS
  Filled 2023-10-13: qty 5

## 2023-10-13 MED ORDER — DIPHENHYDRAMINE HCL 50 MG/ML IJ SOLN
50.0000 mg | Freq: Once | INTRAMUSCULAR | Status: AC
  Administered 2023-10-13: 50 mg via INTRAVENOUS

## 2023-10-13 MED ORDER — OXYCODONE HCL 5 MG PO TABS: 5.0000 mg | ORAL_TABLET | Freq: Four times a day (QID) | ORAL | 0 refills | Status: DC | PRN

## 2023-10-13 MED ORDER — ACETAMINOPHEN 325 MG PO TABS: 650.0000 mg | ORAL_TABLET | Freq: Four times a day (QID) | ORAL | 0 refills | Status: AC | PRN

## 2023-10-13 MED ORDER — HYDROCODONE-ACETAMINOPHEN 5-325 MG PO TABS
1.0000 | ORAL_TABLET | Freq: Once | ORAL | Status: AC
  Administered 2023-10-13: 1 via ORAL
  Filled 2023-10-13: qty 1

## 2023-10-13 MED ORDER — PROPOFOL 10 MG/ML IV BOLUS
0.5000 mg/kg | Freq: Once | INTRAVENOUS | Status: AC
  Administered 2023-10-13: 50 mg via INTRAVENOUS
  Filled 2023-10-13: qty 20

## 2023-10-13 MED ORDER — DROPERIDOL 2.5 MG/ML IJ SOLN
2.5000 mg | Freq: Once | INTRAMUSCULAR | Status: AC
  Administered 2023-10-13: 2.5 mg via INTRAVENOUS
  Filled 2023-10-13: qty 2

## 2023-10-13 MED ORDER — IOHEXOL 350 MG/ML SOLN
75.0000 mL | Freq: Once | INTRAVENOUS | Status: AC | PRN
  Administered 2023-10-13: 75 mL via INTRAVENOUS

## 2023-10-13 MED ORDER — IBUPROFEN 600 MG PO TABS: 600.0000 mg | ORAL_TABLET | Freq: Four times a day (QID) | ORAL | 0 refills | Status: DC | PRN

## 2023-10-13 MED ORDER — ONDANSETRON HCL 4 MG/2ML IJ SOLN
4.0000 mg | Freq: Once | INTRAMUSCULAR | Status: AC
  Administered 2023-10-13: 4 mg via INTRAVENOUS
  Filled 2023-10-13: qty 2

## 2023-10-13 MED ORDER — MORPHINE SULFATE (PF) 4 MG/ML IV SOLN
4.0000 mg | Freq: Once | INTRAVENOUS | Status: AC
  Administered 2023-10-13: 4 mg via INTRAVENOUS
  Filled 2023-10-13: qty 1

## 2023-10-13 NOTE — Progress Notes (Addendum)
 Orthopedic Tech Progress Note Patient Details:  April Barber [birth year] [id number]  Ortho Devices Type of Ortho Device: Knee Immobilizer Ortho Device/Splint Location: rle Ortho Device/Splint Interventions: Ordered, Application, Adjustment  I applied brace post reduction. Post Interventions Patient Tolerated: Well Instructions Provided: Care of device, Adjustment of device  [name] 10/13/2023, 5:20 AM
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
## 2023-10-13 NOTE — ED Notes (Signed)
 Patient returned from CT

## 2023-10-13 NOTE — Progress Notes (Signed)
 Orthopedic Tech Progress Note Patient Details:  [name] Jun 15, 1996 [id number]  Ortho Devices Type of Ortho Device: Crutches Ortho Device/Splint Location: rle Ortho Device/Splint Interventions: Ordered, Application, Adjustment   Post Interventions Patient Tolerated: Well Instructions Provided: Care of device, Adjustment of device  April Barber 10/13/2023, 6:44 AM

## 2023-10-13 NOTE — Progress Notes (Signed)
 RT stood at bedside during conscious sedation. VS stable at time
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
## 2023-10-13 NOTE — ED Notes (Signed)
 Patient transported to CT
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
## 2023-10-15 ENCOUNTER — Ambulatory Visit: Admitting: Physician Assistant

## 2023-10-16 ENCOUNTER — Ambulatory Visit (INDEPENDENT_AMBULATORY_CARE_PROVIDER_SITE_OTHER): Admitting: Physician Assistant

## 2023-10-16 ENCOUNTER — Encounter: Payer: Self-pay | Admitting: Physician Assistant

## 2023-10-16 DIAGNOSIS — S73004A Unspecified dislocation of right hip, initial encounter: Secondary | ICD-10-CM | POA: Insufficient documentation
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
## 2023-10-21 ENCOUNTER — Telehealth: Payer: Self-pay | Admitting: Physician Assistant

## 2023-10-21 NOTE — Telephone Encounter (Signed)
 Patient called and needs a refill on all medication. She stated that the muscle relaxer isn't helping her but to sleep. Oxycodone  and Ibuprofen . CDB#[phone]

## 2023-10-23 ENCOUNTER — Other Ambulatory Visit: Payer: Self-pay | Admitting: Sports Medicine

## 2023-10-23 ENCOUNTER — Encounter: Payer: Self-pay | Admitting: Sports Medicine

## 2023-10-23 MED ORDER — IBUPROFEN 600 MG PO TABS: 600.0000 mg | ORAL_TABLET | Freq: Four times a day (QID) | ORAL | 0 refills | Status: AC | PRN

## 2023-10-23 MED ORDER — OXYCODONE HCL 5 MG PO TABS: 5.0000 mg | ORAL_TABLET | Freq: Four times a day (QID) | ORAL | 0 refills | Status: AC | PRN

## 2023-10-23 NOTE — Progress Notes (Signed)
 One-time refill sent for Oxycodone  (#15 tabs) and Ibuprofen  --> covering for her provider, Mary-Anne Persons. Pain control given recent posterior hip dislocation. These will not be refilled as remainder of care and medication will be addressed with Dr. Hermina Loosen at her follow-up.  Shauna Del, DO Primary Care Sports Medicine Physician  [facility] - Orthopedics  This note was dictated using Dragon naturally speaking software and may contain errors in syntax, spelling, or content which have not been identified prior to signing this note.

## 2023-11-13 ENCOUNTER — Ambulatory Visit (HOSPITAL_BASED_OUTPATIENT_CLINIC_OR_DEPARTMENT_OTHER): Admitting: Orthopaedic Surgery

## 2023-11-13 DIAGNOSIS — S73004A Unspecified dislocation of right hip, initial encounter: Secondary | ICD-10-CM
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
## 2023-11-18 ENCOUNTER — Telehealth (HOSPITAL_BASED_OUTPATIENT_CLINIC_OR_DEPARTMENT_OTHER): Payer: Self-pay | Admitting: Orthopaedic Surgery

## 2023-11-18 ENCOUNTER — Other Ambulatory Visit: Payer: Self-pay | Admitting: Orthopaedic Surgery

## 2023-11-18 MED ORDER — MELOXICAM 15 MG PO TABS: 15.0000 mg | ORAL_TABLET | Freq: Every day | ORAL | 0 refills | Status: AC

## 2023-11-18 NOTE — Telephone Encounter (Signed)
 Patient states that she is in a lot of pain and wants to know if something could be called in for her for pain

## 2023-11-18 NOTE — Telephone Encounter (Signed)
 Called and advised. Pt stated she would try it

## 2023-11-21 ENCOUNTER — Other Ambulatory Visit: Payer: Self-pay

## 2023-11-21 ENCOUNTER — Encounter (HOSPITAL_BASED_OUTPATIENT_CLINIC_OR_DEPARTMENT_OTHER): Payer: Self-pay | Admitting: Physical Therapy

## 2023-11-21 ENCOUNTER — Ambulatory Visit (HOSPITAL_BASED_OUTPATIENT_CLINIC_OR_DEPARTMENT_OTHER): Attending: Orthopaedic Surgery | Admitting: Physical Therapy

## 2023-11-21 DIAGNOSIS — M25651 Stiffness of right hip, not elsewhere classified: Secondary | ICD-10-CM | POA: Diagnosis present

## 2023-11-21 DIAGNOSIS — S73004A Unspecified dislocation of right hip, initial encounter: Secondary | ICD-10-CM | POA: Insufficient documentation

## 2023-11-21 DIAGNOSIS — R2689 Other abnormalities of gait and mobility: Secondary | ICD-10-CM | POA: Insufficient documentation

## 2023-11-21 DIAGNOSIS — M25551 Pain in right hip: Secondary | ICD-10-CM | POA: Diagnosis present
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
## 2023-11-26 ENCOUNTER — Ambulatory Visit (HOSPITAL_BASED_OUTPATIENT_CLINIC_OR_DEPARTMENT_OTHER)

## 2023-11-26 ENCOUNTER — Encounter (HOSPITAL_BASED_OUTPATIENT_CLINIC_OR_DEPARTMENT_OTHER): Payer: Self-pay

## 2023-11-26 DIAGNOSIS — M25551 Pain in right hip: Secondary | ICD-10-CM

## 2023-11-26 DIAGNOSIS — R2689 Other abnormalities of gait and mobility: Secondary | ICD-10-CM | POA: Diagnosis not present

## 2023-11-26 DIAGNOSIS — M25651 Stiffness of right hip, not elsewhere classified: Secondary | ICD-10-CM
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
## 2023-12-03 ENCOUNTER — Ambulatory Visit (HOSPITAL_BASED_OUTPATIENT_CLINIC_OR_DEPARTMENT_OTHER): Attending: Orthopaedic Surgery | Admitting: Physical Therapy

## 2023-12-03 ENCOUNTER — Encounter (HOSPITAL_BASED_OUTPATIENT_CLINIC_OR_DEPARTMENT_OTHER): Payer: Self-pay | Admitting: Physical Therapy

## 2023-12-03 DIAGNOSIS — M25651 Stiffness of right hip, not elsewhere classified: Secondary | ICD-10-CM | POA: Insufficient documentation

## 2023-12-03 DIAGNOSIS — M25551 Pain in right hip: Secondary | ICD-10-CM | POA: Diagnosis present

## 2023-12-03 DIAGNOSIS — R2689 Other abnormalities of gait and mobility: Secondary | ICD-10-CM | POA: Insufficient documentation
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
## 2023-12-10 ENCOUNTER — Encounter (HOSPITAL_BASED_OUTPATIENT_CLINIC_OR_DEPARTMENT_OTHER): Payer: Self-pay

## 2023-12-10 ENCOUNTER — Ambulatory Visit (HOSPITAL_BASED_OUTPATIENT_CLINIC_OR_DEPARTMENT_OTHER): Payer: Self-pay

## 2023-12-10 DIAGNOSIS — R2689 Other abnormalities of gait and mobility: Secondary | ICD-10-CM

## 2023-12-10 DIAGNOSIS — M25551 Pain in right hip: Secondary | ICD-10-CM

## 2023-12-10 DIAGNOSIS — M25651 Stiffness of right hip, not elsewhere classified: Secondary | ICD-10-CM
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
## 2023-12-16 ENCOUNTER — Ambulatory Visit (HOSPITAL_BASED_OUTPATIENT_CLINIC_OR_DEPARTMENT_OTHER): Payer: Self-pay | Admitting: Physical Therapy

## 2023-12-16 ENCOUNTER — Encounter (HOSPITAL_BASED_OUTPATIENT_CLINIC_OR_DEPARTMENT_OTHER): Payer: Self-pay | Admitting: Physical Therapy

## 2023-12-16 DIAGNOSIS — M25551 Pain in right hip: Secondary | ICD-10-CM

## 2023-12-16 DIAGNOSIS — M25651 Stiffness of right hip, not elsewhere classified: Secondary | ICD-10-CM

## 2023-12-16 DIAGNOSIS — R2689 Other abnormalities of gait and mobility: Secondary | ICD-10-CM
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
## 2023-12-18 ENCOUNTER — Ambulatory Visit (HOSPITAL_BASED_OUTPATIENT_CLINIC_OR_DEPARTMENT_OTHER)

## 2023-12-18 ENCOUNTER — Encounter (HOSPITAL_BASED_OUTPATIENT_CLINIC_OR_DEPARTMENT_OTHER): Payer: Self-pay

## 2023-12-18 DIAGNOSIS — M25551 Pain in right hip: Secondary | ICD-10-CM

## 2023-12-18 DIAGNOSIS — M25651 Stiffness of right hip, not elsewhere classified: Secondary | ICD-10-CM

## 2023-12-18 DIAGNOSIS — R2689 Other abnormalities of gait and mobility: Secondary | ICD-10-CM
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
## 2023-12-24 ENCOUNTER — Ambulatory Visit (HOSPITAL_BASED_OUTPATIENT_CLINIC_OR_DEPARTMENT_OTHER): Admitting: Orthopaedic Surgery

## 2023-12-24 ENCOUNTER — Encounter (HOSPITAL_BASED_OUTPATIENT_CLINIC_OR_DEPARTMENT_OTHER): Payer: Self-pay

## 2023-12-24 ENCOUNTER — Ambulatory Visit (HOSPITAL_BASED_OUTPATIENT_CLINIC_OR_DEPARTMENT_OTHER)

## 2023-12-24 DIAGNOSIS — S73004A Unspecified dislocation of right hip, initial encounter: Secondary | ICD-10-CM

## 2023-12-24 DIAGNOSIS — M25551 Pain in right hip: Secondary | ICD-10-CM

## 2023-12-24 DIAGNOSIS — R2689 Other abnormalities of gait and mobility: Secondary | ICD-10-CM | POA: Diagnosis not present

## 2023-12-24 DIAGNOSIS — M25651 Stiffness of right hip, not elsewhere classified: Secondary | ICD-10-CM

## 2023-12-24 MED ORDER — TRIAMCINOLONE ACETONIDE 40 MG/ML IJ SUSP
80.0000 mg | INTRAMUSCULAR | Status: AC | PRN
  Administered 2023-12-24: 80 mg via INTRA_ARTICULAR

## 2023-12-24 MED ORDER — LIDOCAINE HCL 1 % IJ SOLN
4.0000 mL | INTRAMUSCULAR | Status: AC | PRN
  Administered 2023-12-24: 4 mL

## 2023-12-24 NOTE — Addendum Note (Signed)
 Addended by: WOLFGANG CONLEY HERO on: 12/24/2023 08:52 AM   Modules accepted: Orders
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
## 2023-12-30 ENCOUNTER — Telehealth (HOSPITAL_BASED_OUTPATIENT_CLINIC_OR_DEPARTMENT_OTHER): Payer: Self-pay

## 2023-12-30 ENCOUNTER — Ambulatory Visit (HOSPITAL_BASED_OUTPATIENT_CLINIC_OR_DEPARTMENT_OTHER): Attending: Orthopaedic Surgery

## 2023-12-30 DIAGNOSIS — M25651 Stiffness of right hip, not elsewhere classified: Secondary | ICD-10-CM | POA: Insufficient documentation

## 2023-12-30 DIAGNOSIS — R2689 Other abnormalities of gait and mobility: Secondary | ICD-10-CM | POA: Insufficient documentation

## 2023-12-30 DIAGNOSIS — M25551 Pain in right hip: Secondary | ICD-10-CM | POA: Insufficient documentation
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
## 2023-12-30 NOTE — Telephone Encounter (Signed)
 Attempted to call pt regarding missed appt, though phone number did not connect.

## 2024-01-01 ENCOUNTER — Ambulatory Visit (HOSPITAL_BASED_OUTPATIENT_CLINIC_OR_DEPARTMENT_OTHER)

## 2024-01-05 ENCOUNTER — Encounter (HOSPITAL_BASED_OUTPATIENT_CLINIC_OR_DEPARTMENT_OTHER): Payer: Self-pay

## 2024-01-06 ENCOUNTER — Encounter (HOSPITAL_BASED_OUTPATIENT_CLINIC_OR_DEPARTMENT_OTHER): Payer: Self-pay | Admitting: Physical Therapy

## 2024-01-06 ENCOUNTER — Ambulatory Visit (HOSPITAL_BASED_OUTPATIENT_CLINIC_OR_DEPARTMENT_OTHER): Admitting: Physical Therapy

## 2024-01-06 DIAGNOSIS — M25651 Stiffness of right hip, not elsewhere classified: Secondary | ICD-10-CM | POA: Diagnosis present

## 2024-01-06 DIAGNOSIS — R2689 Other abnormalities of gait and mobility: Secondary | ICD-10-CM

## 2024-01-06 DIAGNOSIS — M25551 Pain in right hip: Secondary | ICD-10-CM
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
## 2024-01-07 ENCOUNTER — Encounter: Payer: Self-pay | Admitting: Radiology

## 2024-01-08 ENCOUNTER — Encounter (HOSPITAL_BASED_OUTPATIENT_CLINIC_OR_DEPARTMENT_OTHER): Payer: Self-pay | Admitting: Physical Therapy

## 2024-01-08 ENCOUNTER — Ambulatory Visit (HOSPITAL_BASED_OUTPATIENT_CLINIC_OR_DEPARTMENT_OTHER): Admitting: Physical Therapy

## 2024-01-08 DIAGNOSIS — M25651 Stiffness of right hip, not elsewhere classified: Secondary | ICD-10-CM | POA: Diagnosis not present

## 2024-01-08 DIAGNOSIS — R2689 Other abnormalities of gait and mobility: Secondary | ICD-10-CM

## 2024-01-08 DIAGNOSIS — M25551 Pain in right hip: Secondary | ICD-10-CM
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
## 2024-01-21 ENCOUNTER — Ambulatory Visit (HOSPITAL_BASED_OUTPATIENT_CLINIC_OR_DEPARTMENT_OTHER): Admitting: Orthopaedic Surgery

## 2024-05-03 ENCOUNTER — Encounter: Payer: Self-pay | Admitting: Radiology
